# Patient Record
Sex: Male | Born: 1960
Health system: Southern US, Community
[De-identification: ages and names within clinical notes are randomized; demographics above are authoritative.]

## PROBLEM LIST (undated history)

## (undated) DIAGNOSIS — I10 Essential (primary) hypertension: Secondary | ICD-10-CM

## (undated) DIAGNOSIS — M1990 Unspecified osteoarthritis, unspecified site: Secondary | ICD-10-CM

## (undated) DIAGNOSIS — I82409 Acute embolism and thrombosis of unspecified deep veins of unspecified lower extremity: Secondary | ICD-10-CM

## (undated) DIAGNOSIS — M62838 Other muscle spasm: Secondary | ICD-10-CM

## (undated) DIAGNOSIS — M961 Postlaminectomy syndrome, not elsewhere classified: Secondary | ICD-10-CM

## (undated) DIAGNOSIS — M5137 Other intervertebral disc degeneration, lumbosacral region: Secondary | ICD-10-CM

## (undated) DIAGNOSIS — K4090 Unilateral inguinal hernia, without obstruction or gangrene, not specified as recurrent: Secondary | ICD-10-CM

## (undated) DIAGNOSIS — M51379 Other intervertebral disc degeneration, lumbosacral region without mention of lumbar back pain or lower extremity pain: Secondary | ICD-10-CM

## (undated) DIAGNOSIS — E559 Vitamin D deficiency, unspecified: Secondary | ICD-10-CM

## (undated) DIAGNOSIS — R0989 Other specified symptoms and signs involving the circulatory and respiratory systems: Secondary | ICD-10-CM

## (undated) DIAGNOSIS — F1120 Opioid dependence, uncomplicated: Secondary | ICD-10-CM

## (undated) DIAGNOSIS — F410 Panic disorder [episodic paroxysmal anxiety] without agoraphobia: Secondary | ICD-10-CM

## (undated) DIAGNOSIS — Z9862 Peripheral vascular angioplasty status: Secondary | ICD-10-CM

## (undated) DIAGNOSIS — E119 Type 2 diabetes mellitus without complications: Secondary | ICD-10-CM

## (undated) DIAGNOSIS — E785 Hyperlipidemia, unspecified: Secondary | ICD-10-CM

## (undated) DIAGNOSIS — N529 Male erectile dysfunction, unspecified: Secondary | ICD-10-CM

## (undated) HISTORY — DX: Other muscle spasm: M62.838

## (undated) HISTORY — DX: Postlaminectomy syndrome, not elsewhere classified: M96.1

## (undated) HISTORY — PX: LAMINECTOMY: SHX219

## (undated) HISTORY — DX: Male erectile dysfunction, unspecified: N52.9

## (undated) HISTORY — DX: Panic disorder (episodic paroxysmal anxiety): F41.0

## (undated) HISTORY — DX: Vitamin D deficiency, unspecified: E55.9

## (undated) HISTORY — DX: Peripheral vascular angioplasty status: Z98.62

## (undated) HISTORY — DX: Opioid dependence, uncomplicated: F11.20

## (undated) HISTORY — PX: BACK SURGERY: SHX140

## (undated) HISTORY — DX: Unspecified osteoarthritis, unspecified site: M19.90

## (undated) HISTORY — DX: Hyperlipidemia, unspecified: E78.5

## (undated) HISTORY — DX: Other intervertebral disc degeneration, lumbosacral region: M51.37

## (undated) HISTORY — DX: Other intervertebral disc degeneration, lumbosacral region without mention of lumbar back pain or lower extremity pain: M51.379

## (undated) HISTORY — DX: Essential (primary) hypertension: I10

## (undated) HISTORY — DX: Other specified symptoms and signs involving the circulatory and respiratory systems: R09.89

---

## 1999-03-07 ENCOUNTER — Encounter: Payer: Self-pay | Admitting: Neurosurgery

## 1999-03-07 ENCOUNTER — Observation Stay (HOSPITAL_COMMUNITY): Admission: RE | Admit: 1999-03-07 | Discharge: 1999-03-07 | Payer: Self-pay | Admitting: Neurosurgery

## 2001-07-09 HISTORY — PX: INGUINAL HERNIA REPAIR: SUR1180

## 2001-07-09 HISTORY — PX: ARTHRODESIS: SHX136

## 2008-07-09 HISTORY — PX: CARDIAC CATHETERIZATION: SHX172

## 2011-11-23 ENCOUNTER — Encounter: Payer: Self-pay | Admitting: Cardiovascular Disease

## 2011-11-29 ENCOUNTER — Ambulatory Visit: Payer: Self-pay | Admitting: Cardiovascular Disease

## 2012-04-21 ENCOUNTER — Encounter: Payer: Self-pay | Admitting: Cardiovascular Disease

## 2012-04-21 ENCOUNTER — Ambulatory Visit (INDEPENDENT_AMBULATORY_CARE_PROVIDER_SITE_OTHER): Payer: PRIVATE HEALTH INSURANCE | Admitting: Cardiovascular Disease

## 2012-04-21 VITALS — BP 122/82 | HR 78 | Ht 70.0 in | Wt 200.0 lb

## 2012-04-21 DIAGNOSIS — R079 Chest pain, unspecified: Secondary | ICD-10-CM

## 2012-04-21 DIAGNOSIS — R42 Dizziness and giddiness: Secondary | ICD-10-CM | POA: Insufficient documentation

## 2012-04-21 DIAGNOSIS — R000 Tachycardia, unspecified: Secondary | ICD-10-CM

## 2012-04-21 DIAGNOSIS — F17200 Nicotine dependence, unspecified, uncomplicated: Secondary | ICD-10-CM

## 2012-04-21 DIAGNOSIS — E785 Hyperlipidemia, unspecified: Secondary | ICD-10-CM | POA: Insufficient documentation

## 2012-04-21 DIAGNOSIS — I739 Peripheral vascular disease, unspecified: Secondary | ICD-10-CM | POA: Insufficient documentation

## 2012-04-21 DIAGNOSIS — I10 Essential (primary) hypertension: Secondary | ICD-10-CM

## 2012-04-21 NOTE — Assessment & Plan Note (Signed)
We have recommended he restart his Lipitor.

## 2012-04-21 NOTE — Assessment & Plan Note (Signed)
He reports having disease of the left lower extremity. We have encouraged him to quit smoking and restart his cholesterol medication.

## 2012-04-21 NOTE — Assessment & Plan Note (Signed)
We have recommended he monitor his blood pressure at home. Potentially the lisinopril could be weaned if not needed.

## 2012-04-21 NOTE — Assessment & Plan Note (Signed)
Dizziness has resolved. Likely secondary to orthostasis from lisinopril. Improved symptoms on low-dose lisinopril. We have suggested he closely monitor his blood pressure. Uncertain if he needs any blood pressure medication without additional numbers/measurements. Perhaps this could be held if blood pressure is well controlled without medication.

## 2012-04-21 NOTE — Assessment & Plan Note (Signed)
Episode of tachycardia on last clinic visit his primary care physician. He denies any symptoms or additional episodes of tachycardia. We will try to obtain the EKG for our records. We have asked him to monitor his heart rate by checking his pulse during the daytime. We will order a Holter monitor for any symptoms of shortness of breath or chest tightness.

## 2012-04-21 NOTE — Patient Instructions (Addendum)
You are doing well. Please restart cholesterol pill and vit D  Please call us if you have new issues that need to be addressed before your next appt.  Your physician wants you to follow-up in: 12 months.  You will receive a reminder letter in the mail two months in advance. If you don't receive a letter, please call our office to schedule the follow-up appointment.

## 2012-04-21 NOTE — Assessment & Plan Note (Signed)
We have encouraged him to try electronic cigarettes. He currently smokes 2 packs per day.

## 2012-04-21 NOTE — Progress Notes (Signed)
Patient ID: Michael Giles, male    DOB: [birth year], 51 y.o.   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  He reports that when he was on lisinopril 10 mg daily, he would have dizziness when standing from a squatting position. Lisinopril was decreased to 5 mg daily and his symptoms have resolved. He attributed his symptoms to too much blood pressure medication.  Heart rate was reported in the 120 range on his last visit to his primary care physician. He denies any symptoms of palpitations or tachycardia. This has not been an issue in the past. EKG is not available for our review today.  Otherwise he feels well, is active with no complaints..  He reports that his father had heart attack in his 72s and is a long  Smoker.  He did have muscle cramping previously on statin and the dose was cut in half . He is currently not on a statin . Also problems in the past with low vitamin D. He does not take vitamin D at this time   EKG today shows normal sinus rhythm with rate 78 beats per minute with no significant ST or T. changes    Outpatient Encounter Prescriptions as of 04/21/2012  Medication Sig Dispense Refill  . aspirin 81 MG tablet Take 81 mg by mouth daily.      . cilostazol (PLETAL) 100 MG tablet Take 100 mg by mouth 2 (two) times daily.      . Cyanocobalamin (VITAMIN B 12 PO) Take 2,000 mg by mouth daily.      Marland Kitchen lisinopril (PRINIVIL,ZESTRIL) 5 MG tablet Take 5 mg by mouth daily.      Marland Kitchen oxyCODONE (OXYCONTIN) 80 MG 12 hr tablet Take 80 mg by mouth 3 (three) times daily.        Review of Systems  Constitutional:  Negative.   HENT: Negative.   Eyes: Negative.   Respiratory: Negative.   Cardiovascular: Negative.   Gastrointestinal: Negative.   Musculoskeletal: Positive for back pain.  Skin: Negative.   Neurological: Negative.   Hematological: Negative.   Psychiatric/Behavioral: Negative.   All other systems reviewed and are negative.    BP 122/82  Pulse 78  Ht 5\' 10"  (1.778 m)  Wt 200 lb (90.719 kg)  BMI 28.70 kg/m2  Physical Exam  Nursing note and vitals reviewed. Constitutional: He is oriented to person, place, and time. He appears well-developed and well-nourished.  HENT:  Head: Normocephalic.  Nose: Nose normal.  Mouth/Throat: Oropharynx is clear and moist.  Eyes: Conjunctivae normal are normal. Pupils are equal, round, and reactive to light.  Neck: Normal range of motion. Neck supple. No JVD present.  Cardiovascular: Normal rate, regular rhythm, S1 normal, S2 normal, normal heart sounds and intact distal pulses.  Exam reveals no gallop and no friction rub.   No murmur heard. Pulmonary/Chest: Effort normal and breath sounds normal. No respiratory distress. He has no wheezes. He has no rales. He exhibits no tenderness.  Abdominal: Soft. Bowel sounds are normal. He exhibits no distension. There is no tenderness.  Musculoskeletal: Normal range of motion. He exhibits no edema and no tenderness.  Lymphadenopathy:    He has no  cervical adenopathy.  Neurological: He is alert and oriented to person, place, and time. Coordination normal.  Skin: Skin is warm and dry. No rash noted. No erythema.  Psychiatric: He has a normal mood and affect. His behavior is normal. Judgment and thought content normal.           Assessment and Plan

## 2012-05-07 ENCOUNTER — Encounter: Payer: Self-pay | Admitting: Cardiovascular Disease

## 2012-05-26 ENCOUNTER — Ambulatory Visit: Payer: Self-pay | Admitting: Family Medicine

## 2012-11-28 LAB — HEMOGLOBIN A1C: HEMOGLOBIN A1C: 5.9 % (ref 4.0–6.0)

## 2013-03-03 ENCOUNTER — Ambulatory Visit: Payer: Self-pay | Admitting: Family Medicine

## 2013-05-15 ENCOUNTER — Ambulatory Visit: Payer: Self-pay | Admitting: Vascular Surgery

## 2014-01-19 LAB — LIPID PANEL
Cholesterol: 175 mg/dL (ref 0–200)
HDL: 39 mg/dL (ref 35–70)
LDL CALC: 100 mg/dL
Triglycerides: 182 mg/dL — AB (ref 40–160)

## 2014-03-24 ENCOUNTER — Encounter (HOSPITAL_COMMUNITY): Payer: Self-pay | Admitting: Emergency Medicine

## 2014-03-24 ENCOUNTER — Emergency Department (HOSPITAL_COMMUNITY)
Admission: EM | Admit: 2014-03-24 | Discharge: 2014-03-24 | Disposition: A | Discharge status: Home / Self Care | Payer: PRIVATE HEALTH INSURANCE | Attending: Emergency Medicine | Admitting: Emergency Medicine

## 2014-03-24 DIAGNOSIS — Z9861 Coronary angioplasty status: Secondary | ICD-10-CM | POA: Diagnosis not present

## 2014-03-24 DIAGNOSIS — F17200 Nicotine dependence, unspecified, uncomplicated: Secondary | ICD-10-CM | POA: Diagnosis not present

## 2014-03-24 DIAGNOSIS — S0083XA Contusion of other part of head, initial encounter: Secondary | ICD-10-CM | POA: Insufficient documentation

## 2014-03-24 DIAGNOSIS — S0003XA Contusion of scalp, initial encounter: Secondary | ICD-10-CM | POA: Diagnosis not present

## 2014-03-24 DIAGNOSIS — Z9889 Other specified postprocedural states: Secondary | ICD-10-CM | POA: Insufficient documentation

## 2014-03-24 DIAGNOSIS — Y9389 Activity, other specified: Secondary | ICD-10-CM | POA: Insufficient documentation

## 2014-03-24 DIAGNOSIS — IMO0002 Reserved for concepts with insufficient information to code with codable children: Secondary | ICD-10-CM | POA: Insufficient documentation

## 2014-03-24 DIAGNOSIS — Z862 Personal history of diseases of the blood and blood-forming organs and certain disorders involving the immune mechanism: Secondary | ICD-10-CM | POA: Insufficient documentation

## 2014-03-24 DIAGNOSIS — F3289 Other specified depressive episodes: Secondary | ICD-10-CM | POA: Diagnosis not present

## 2014-03-24 DIAGNOSIS — Z8679 Personal history of other diseases of the circulatory system: Secondary | ICD-10-CM | POA: Insufficient documentation

## 2014-03-24 DIAGNOSIS — Y92410 Unspecified street and highway as the place of occurrence of the external cause: Secondary | ICD-10-CM | POA: Insufficient documentation

## 2014-03-24 DIAGNOSIS — F329 Major depressive disorder, single episode, unspecified: Secondary | ICD-10-CM | POA: Insufficient documentation

## 2014-03-24 DIAGNOSIS — Z7982 Long term (current) use of aspirin: Secondary | ICD-10-CM | POA: Diagnosis not present

## 2014-03-24 DIAGNOSIS — E559 Vitamin D deficiency, unspecified: Secondary | ICD-10-CM | POA: Insufficient documentation

## 2014-03-24 DIAGNOSIS — S0993XA Unspecified injury of face, initial encounter: Secondary | ICD-10-CM | POA: Diagnosis present

## 2014-03-24 DIAGNOSIS — Z8639 Personal history of other endocrine, nutritional and metabolic disease: Secondary | ICD-10-CM | POA: Insufficient documentation

## 2014-03-24 DIAGNOSIS — Z981 Arthrodesis status: Secondary | ICD-10-CM | POA: Insufficient documentation

## 2014-03-24 DIAGNOSIS — Z79899 Other long term (current) drug therapy: Secondary | ICD-10-CM | POA: Diagnosis not present

## 2014-03-24 DIAGNOSIS — I10 Essential (primary) hypertension: Secondary | ICD-10-CM | POA: Diagnosis not present

## 2014-03-24 DIAGNOSIS — M129 Arthropathy, unspecified: Secondary | ICD-10-CM | POA: Insufficient documentation

## 2014-03-24 DIAGNOSIS — S0120XA Unspecified open wound of nose, initial encounter: Secondary | ICD-10-CM | POA: Insufficient documentation

## 2014-03-24 DIAGNOSIS — Z8739 Personal history of other diseases of the musculoskeletal system and connective tissue: Secondary | ICD-10-CM | POA: Diagnosis not present

## 2014-03-24 DIAGNOSIS — S1093XA Contusion of unspecified part of neck, initial encounter: Secondary | ICD-10-CM | POA: Diagnosis not present

## 2014-03-24 DIAGNOSIS — F410 Panic disorder [episodic paroxysmal anxiety] without agoraphobia: Secondary | ICD-10-CM | POA: Insufficient documentation

## 2014-03-24 DIAGNOSIS — S199XXA Unspecified injury of neck, initial encounter: Secondary | ICD-10-CM

## 2014-03-24 MED ORDER — OXYCODONE-ACETAMINOPHEN 10-325 MG PO TABS: 1.0000 | ORAL_TABLET | ORAL | Status: DC | PRN

## 2014-03-24 MED ORDER — DIAZEPAM 2 MG PO TABS: 2.0000 mg | ORAL_TABLET | Freq: Four times a day (QID) | ORAL | Status: DC | PRN
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
## 2014-03-24 NOTE — Discharge Instructions (Signed)
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
## 2014-03-27 ENCOUNTER — Emergency Department: Payer: Self-pay | Admitting: Internal Medicine
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
## 2014-04-03 ENCOUNTER — Emergency Department: Payer: Self-pay | Admitting: Emergency Medicine

## 2014-04-26 ENCOUNTER — Ambulatory Visit: Payer: Self-pay | Admitting: Family Medicine

## 2014-11-22 ENCOUNTER — Other Ambulatory Visit: Payer: Self-pay

## 2014-11-22 DIAGNOSIS — M5136 Other intervertebral disc degeneration, lumbar region: Secondary | ICD-10-CM | POA: Insufficient documentation

## 2014-11-22 DIAGNOSIS — I10 Essential (primary) hypertension: Secondary | ICD-10-CM | POA: Insufficient documentation

## 2014-11-22 DIAGNOSIS — M51369 Other intervertebral disc degeneration, lumbar region without mention of lumbar back pain or lower extremity pain: Secondary | ICD-10-CM | POA: Insufficient documentation

## 2014-11-22 DIAGNOSIS — Z79899 Other long term (current) drug therapy: Secondary | ICD-10-CM | POA: Insufficient documentation

## 2014-11-22 DIAGNOSIS — I739 Peripheral vascular disease, unspecified: Secondary | ICD-10-CM | POA: Insufficient documentation

## 2014-11-22 DIAGNOSIS — E785 Hyperlipidemia, unspecified: Secondary | ICD-10-CM | POA: Insufficient documentation

## 2014-11-22 DIAGNOSIS — N529 Male erectile dysfunction, unspecified: Secondary | ICD-10-CM | POA: Insufficient documentation

## 2014-11-22 DIAGNOSIS — M961 Postlaminectomy syndrome, not elsewhere classified: Secondary | ICD-10-CM | POA: Insufficient documentation

## 2014-11-22 DIAGNOSIS — F17200 Nicotine dependence, unspecified, uncomplicated: Secondary | ICD-10-CM | POA: Insufficient documentation

## 2014-11-22 DIAGNOSIS — F410 Panic disorder [episodic paroxysmal anxiety] without agoraphobia: Secondary | ICD-10-CM | POA: Insufficient documentation

## 2014-11-22 DIAGNOSIS — F329 Major depressive disorder, single episode, unspecified: Secondary | ICD-10-CM | POA: Insufficient documentation

## 2014-11-22 DIAGNOSIS — G479 Sleep disorder, unspecified: Secondary | ICD-10-CM | POA: Insufficient documentation

## 2014-11-22 DIAGNOSIS — F32A Depression, unspecified: Secondary | ICD-10-CM | POA: Insufficient documentation

## 2014-11-22 DIAGNOSIS — F1120 Opioid dependence, uncomplicated: Secondary | ICD-10-CM | POA: Insufficient documentation

## 2014-11-22 DIAGNOSIS — R0989 Other specified symptoms and signs involving the circulatory and respiratory systems: Secondary | ICD-10-CM | POA: Insufficient documentation

## 2014-12-08 ENCOUNTER — Encounter: Payer: Self-pay | Admitting: Family Medicine

## 2014-12-08 ENCOUNTER — Ambulatory Visit (INDEPENDENT_AMBULATORY_CARE_PROVIDER_SITE_OTHER): Payer: 59 | Admitting: Family Medicine

## 2014-12-08 VITALS — BP 140/88 | HR 76 | Resp 15 | Ht 67.0 in | Wt 190.1 lb

## 2014-12-08 DIAGNOSIS — G8929 Other chronic pain: Secondary | ICD-10-CM | POA: Diagnosis not present

## 2014-12-08 DIAGNOSIS — M5416 Radiculopathy, lumbar region: Secondary | ICD-10-CM | POA: Diagnosis not present

## 2014-12-08 DIAGNOSIS — I10 Essential (primary) hypertension: Secondary | ICD-10-CM

## 2014-12-08 MED ORDER — LISINOPRIL 20 MG PO TABS: 20.0000 mg | ORAL_TABLET | Freq: Every day | ORAL | Status: DC

## 2014-12-08 MED ORDER — PERCOCET 10-325 MG PO TABS: 1.0000 | ORAL_TABLET | Freq: Three times a day (TID) | ORAL | Status: DC | PRN

## 2014-12-08 MED ORDER — OXYCODONE HCL ER 30 MG PO T12A
30.0000 mg | EXTENDED_RELEASE_TABLET | Freq: Two times a day (BID) | ORAL | Status: DC
Start: 2014-12-08 — End: 2015-01-06

## 2014-12-08 NOTE — Progress Notes (Signed)
Name: Michael Giles   MRN: [id number]    DOB: 1960-12-24   Date:12/08/2014       Progress Note  Subjective  Chief Complaint  Chief Complaint  Patient presents with  . Back Pain    HPI Patient is here for medication refills for chronic low back pain. Pain is rated at 8/10. Described as stinging and burning in the left leg, down to his knee. He is on Oxycontin 30mg  1 tab every 12 hours and Oxycodone 10-325mg  1 tablet every 8 hours as needed. Pain is relieved by opioids. No side effects reported from medications. Patient also has received lower back injections by Dr[facility] Abigail [facility]Barco [facility] which have helped temporarily.   No problem-specific assessment & plan notes found for this encounter.   Past Medical History  Diagnosis Date  . Impotence of organic origin   . Opioid type dependence, continuous   . Postlaminectomy syndrome, lumbar region   . Spasm of muscle   . Other symptoms involving cardiovascular system   . Degeneration of lumbar or lumbosacral intervertebral disc   . Unspecified vitamin D deficiency   . Hypertension   . Hyperlipidemia   . Panic disorder without agoraphobia   . History of angioplasty   . Arthritis    Past Surgical History  Procedure Laterality Date  . Laminectomy    . Inguinal hernia repair  2003  . Arthrodesis  2003  . Cardiac catheterization  2010    [facility]/65% blockage left leg no stent  . Back surgery     Family History  Problem Relation Age of Onset  . Heart disease Father   . Heart attack Father     History  Substance Use Topics  . Smoking status: Current Every Day Smoker -- 2.00 packs/day for 32 years    Types: Cigarettes  . Smokeless tobacco: Not on file  . Alcohol Use: No     Current outpatient prescriptions:  .  aspirin 81 MG tablet, Take 81 mg by mouth daily., Disp: , Rfl:  .  cilostazol (PLETAL) 100 MG tablet, Take 100 mg by mouth 2 (two) times daily., Disp: , Rfl:  .  lisinopril (PRINIVIL,ZESTRIL) 20 MG tablet, Take by  mouth., Disp: , Rfl:  .  OxyCODONE HCl ER 30 MG T12A, Take 30 mg by mouth every 12 (twelve) hours., Disp: 60 each, Rfl: 0 .  PERCOCET 10-325 MG per tablet, Take 1 tablet by mouth every 8 (eight) hours as needed for pain., Disp: 90 tablet, Rfl: 0 .  tadalafil (CIALIS) 10 MG tablet, Take 10 mg by mouth daily as needed for erectile dysfunction. , Disp: , Rfl:   Allergies  Allergen Reactions  . Crestor [Rosuvastatin]     joint pain    Review of Systems  Musculoskeletal: Positive for back pain.      Objective  Filed Vitals:   12/08/14 0927 12/08/14 1055  BP: 160/90 140/88  Pulse: 76   Resp: 15   Height: 5\' 7"  (1.702 m)   Weight: 190 lb 1.6 oz (86.229 kg)      Physical Exam  Constitutional: He is well-developed, well-nourished, and in no distress.  Cardiovascular: Regular rhythm.   Pulmonary/Chest: Breath sounds normal.  Musculoskeletal:       Lumbar back: He exhibits pain.       Back:  Stinging and burning of left thigh. Pain on the right side of spine, described as a constant throb all the time.  Vitals reviewed.   Assessment & Plan  1. Chronic radicular lumbar pain  - OxyCODONE HCl ER 30 MG T12A; Take 30 mg by mouth every 12 (twelve) hours.  Dispense: 60 each; Refill: 0 - PERCOCET 10-325 MG per tablet; Take 1 tablet by mouth every 8 (eight) hours as needed for pain.  Dispense: 90 tablet; Refill: 0 Chronic radicular low back pain: Continue with Oxycontin and Oxycodone as needed. New Controlled Substances contract signed. Pt. Is going to follow up with neurosurgery as well.  2. Essential hypertension  - lisinopril (PRINIVIL,ZESTRIL) 20 MG tablet; Take 1 tablet (20 mg total) by mouth daily.  Dispense: 30 tablet; Refill: 2 Hypertension: Repeat BP is lower. Reassured. Follow up in 1 month.

## 2014-12-16 ENCOUNTER — Telehealth: Payer: Self-pay | Admitting: Family Medicine

## 2014-12-16 DIAGNOSIS — G8929 Other chronic pain: Secondary | ICD-10-CM

## 2014-12-16 DIAGNOSIS — M5416 Radiculopathy, lumbar region: Principal | ICD-10-CM

## 2014-12-17 NOTE — Telephone Encounter (Signed)
Can you please help me.  Was there a referral entered or do I need to do something?

## 2014-12-17 NOTE — Telephone Encounter (Signed)
Called and left a message for patient. Referral for [facility] is entered in his chart

## 2015-01-06 ENCOUNTER — Ambulatory Visit: Payer: Self-pay | Admitting: Family Medicine

## 2015-01-06 ENCOUNTER — Encounter: Payer: Self-pay | Admitting: Family Medicine

## 2015-01-06 ENCOUNTER — Ambulatory Visit (INDEPENDENT_AMBULATORY_CARE_PROVIDER_SITE_OTHER): Payer: 59 | Admitting: Family Medicine

## 2015-01-06 VITALS — BP 138/84 | HR 86 | Temp 97.9°F | Resp 18 | Wt 193.4 lb

## 2015-01-06 DIAGNOSIS — I10 Essential (primary) hypertension: Secondary | ICD-10-CM | POA: Diagnosis not present

## 2015-01-06 DIAGNOSIS — G8929 Other chronic pain: Secondary | ICD-10-CM

## 2015-01-06 DIAGNOSIS — M5416 Radiculopathy, lumbar region: Secondary | ICD-10-CM

## 2015-01-06 MED ORDER — OXYCODONE HCL ER 30 MG PO T12A: 30.0000 mg | EXTENDED_RELEASE_TABLET | Freq: Two times a day (BID) | ORAL | Status: DC

## 2015-01-06 MED ORDER — PERCOCET 10-325 MG PO TABS: 1.0000 | ORAL_TABLET | Freq: Three times a day (TID) | ORAL | Status: DC | PRN

## 2015-01-06 NOTE — Progress Notes (Signed)
Name: Michael Giles   MRN: [id number]    DOB: 1961/07/03   Date:01/06/2015       Progress Note  Subjective  Chief Complaint  Chief Complaint  Patient presents with  . Hypertension  . Back Pain    Hypertension This is a chronic problem. The problem is unchanged. The problem is controlled. Pertinent negatives include no chest pain, headaches, palpitations or shortness of breath. Past treatments include ACE inhibitors. The current treatment provides moderate improvement. There is no history of angina, kidney disease, CAD/MI or CVA.  Back Pain This is a chronic problem. The problem is unchanged. The pain is present in the lumbar spine and gluteal. The quality of the pain is described as burning and stabbing. The pain radiates to the left knee and left thigh. The pain is at a severity of 8/10. The pain is severe. The symptoms are aggravated by position (walking). Associated symptoms include numbness (numbness in left leg down to his left knee). Pertinent negatives include no bladder incontinence, bowel incontinence, chest pain or headaches. He has tried analgesics and NSAIDs for the symptoms.      Past Medical History  Diagnosis Date  . Impotence of organic origin   . Opioid type dependence, continuous   . Postlaminectomy syndrome, lumbar region   . Spasm of muscle   . Other symptoms involving cardiovascular system   . Degeneration of lumbar or lumbosacral intervertebral disc   . Unspecified vitamin D deficiency   . Hypertension   . Hyperlipidemia   . Panic disorder without agoraphobia   . History of angioplasty   . Arthritis     Past Surgical History  Procedure Laterality Date  . Laminectomy    . Inguinal hernia repair  2003  . Arthrodesis  2003  . Cardiac catheterization  2010    [facility]/65% blockage left leg no stent  . Back surgery      Family History  Problem Relation Age of Onset  . Heart disease Father   . Heart attack Father     History   Social History  .  Marital Status: Married    Spouse Name: N/A  . Number of Children: N/A  . Years of Education: N/A   Occupational History  . Not on file.   Social History Main Topics  . Smoking status: Current Every Day Smoker -- 2.00 packs/day for 32 years    Types: Cigarettes  . Smokeless tobacco: Not on file  . Alcohol Use: No  . Drug Use: No  . Sexual Activity: Not on file   Other Topics Concern  . Not on file   Social History Narrative     Current outpatient prescriptions:  .  aspirin 81 MG tablet, Take 81 mg by mouth daily., Disp: , Rfl:  .  cilostazol (PLETAL) 100 MG tablet, Take 100 mg by mouth 2 (two) times daily., Disp: , Rfl:  .  lisinopril (PRINIVIL,ZESTRIL) 20 MG tablet, Take 1 tablet (20 mg total) by mouth daily., Disp: 30 tablet, Rfl: 2 .  OxyCODONE HCl ER 30 MG T12A, Take 30 mg by mouth every 12 (twelve) hours., Disp: 60 each, Rfl: 0 .  PERCOCET 10-325 MG per tablet, Take 1 tablet by mouth every 8 (eight) hours as needed for pain., Disp: 90 tablet, Rfl: 0 .  tadalafil (CIALIS) 10 MG tablet, Take 10 mg by mouth daily as needed for erectile dysfunction. , Disp: , Rfl:   Allergies  Allergen Reactions  . Crestor [Rosuvastatin]  joint pain     Review of Systems  Respiratory: Negative for shortness of breath.   Cardiovascular: Negative for chest pain and palpitations.  Gastrointestinal: Negative for bowel incontinence.  Genitourinary: Negative for bladder incontinence.  Musculoskeletal: Positive for back pain.  Neurological: Positive for numbness (numbness in left leg down to his left knee). Negative for headaches.      Objective  Filed Vitals:   01/06/15 1010  BP: 138/84  Pulse: 86  Temp: 97.9 F (36.6 C)  Resp: 18  Weight: 193 lb 6 oz (87.714 kg)  SpO2: 99%    Physical Exam  Constitutional: He is well-developed, well-nourished, and in no distress.  HENT:  Head: Normocephalic and atraumatic.  Cardiovascular: Normal rate and regular rhythm.     Pulmonary/Chest: Effort normal and breath sounds normal.  Musculoskeletal:       Lumbar back: He exhibits tenderness, pain and spasm.       Back:  Nursing note and vitals reviewed.     Assessment & Plan 1. Essential hypertension Blood pressure is stable and controlled on lisinopril 20 mg daily. Continue therapy. Recheck in 3 months  2. Chronic radicular lumbar pain Patient has chronic radicular low back pain. He has been referred to [facility] for management of chronic opioid therapy. He is also following up with a spine specialist.  - OxyCODONE HCl ER 30 MG T12A; Take 30 mg by mouth every 12 (twelve) hours.  Dispense: 60 each; Refill: 0 - PERCOCET 10-325 MG per tablet; Take 1 tablet by mouth every 8 (eight) hours as needed for pain.  Dispense: 90 tablet; Refill: 0   Michael Giles Michael A. Faylene KurtzShah [facility] 01/06/2015 6:13 PM

## 2015-01-14 ENCOUNTER — Telehealth: Payer: Self-pay | Admitting: Family Medicine

## 2015-01-14 NOTE — Telephone Encounter (Signed)
Patient is checking status on the authorization for oxycodone. He will be completely out in a couple of days.

## 2015-01-17 NOTE — Telephone Encounter (Signed)
Patient needs pre-authorization for medication to be refilled, I told patient to contact pain management per Dr. Sherryll BurgerShah

## 2015-01-17 NOTE — Telephone Encounter (Signed)
Patient calling to get update on when RX will be ready to pick up?

## 2015-01-25 ENCOUNTER — Telehealth: Payer: Self-pay | Admitting: Family Medicine

## 2015-01-25 NOTE — Telephone Encounter (Signed)
PER SHELLY KNOWS ABOUT THIS AND IS TAKING CARE OF IT.

## 2015-01-25 NOTE — Telephone Encounter (Signed)
STATUS ON AUTH

## 2015-01-25 NOTE — Telephone Encounter (Signed)
Spoke with Mr. Ander GasterHorner, explained to him I am waiting on an approval or denial from his insurance company once I receive that, I'll notify him of the status.

## 2015-02-01 ENCOUNTER — Telehealth: Payer: Self-pay

## 2015-02-01 NOTE — Telephone Encounter (Signed)
Called patient and notified him that his Medication Oxycodone was approved but, was going to take about 2 hrs to process, he thanked he for all my help. I told him that NPS would notify his pharmacy once approval had been processed.

## 2015-02-03 ENCOUNTER — Ambulatory Visit (INDEPENDENT_AMBULATORY_CARE_PROVIDER_SITE_OTHER): Payer: BLUE CROSS/BLUE SHIELD | Admitting: Family Medicine

## 2015-02-03 ENCOUNTER — Encounter: Payer: Self-pay | Admitting: Family Medicine

## 2015-02-03 VITALS — BP 130/77 | HR 95 | Temp 98.5°F | Resp 18 | Ht 67.0 in | Wt 192.0 lb

## 2015-02-03 DIAGNOSIS — G8929 Other chronic pain: Secondary | ICD-10-CM

## 2015-02-03 DIAGNOSIS — M5416 Radiculopathy, lumbar region: Secondary | ICD-10-CM

## 2015-02-03 MED ORDER — PERCOCET 10-325 MG PO TABS: 1.0000 | ORAL_TABLET | Freq: Three times a day (TID) | ORAL | Status: DC | PRN

## 2015-02-03 MED ORDER — OXYCODONE HCL ER 30 MG PO T12A: 30.0000 mg | EXTENDED_RELEASE_TABLET | Freq: Two times a day (BID) | ORAL | Status: DC

## 2015-02-03 NOTE — Progress Notes (Signed)
Name: Michael Giles   MRN: [id number]    DOB: Feb 24, 1961   Date:02/03/2015       Progress Note  Subjective  Chief Complaint  Chief Complaint  Patient presents with  . Follow-up    4 wk.   . Hyperlipidemia  . Medication Refill    Back Pain This is a chronic problem. The problem is unchanged. The pain is present in the lumbar spine and gluteal. The quality of the pain is described as burning and stabbing. The pain radiates to the left knee and left thigh. The pain is at a severity of 8/10. The pain is severe. The symptoms are aggravated by position (walking). Associated symptoms include numbness (numbness in left leg down to his left knee). Pertinent negatives include no bladder incontinence, bowel incontinence, dysuria or fever. He has tried analgesics and NSAIDs (Pt. has an appointment with Pain Clinic on [facility] 15th, 2016.) for the symptoms. The treatment provided significant relief.      Past Medical History  Diagnosis Date  . Impotence of organic origin   . Opioid type dependence, continuous   . Postlaminectomy syndrome, lumbar region   . Spasm of muscle   . Other symptoms involving cardiovascular system   . Degeneration of lumbar or lumbosacral intervertebral disc   . Unspecified vitamin D deficiency   . Hypertension   . Hyperlipidemia   . Panic disorder without agoraphobia   . History of angioplasty   . Arthritis     Past Surgical History  Procedure Laterality Date  . Laminectomy    . Inguinal hernia repair  2003  . Arthrodesis  2003  . Cardiac catheterization  2010    [facility]/65% blockage left leg no stent  . Back surgery      Family History  Problem Relation Age of Onset  . Heart disease Father   . Heart attack Father     History   Social History  . Marital Status: Married    Spouse Name: N/A  . Number of Children: N/A  . Years of Education: N/A   Occupational History  . Not on file.   Social History Main Topics  . Smoking status: Current Every  Day Smoker -- 2.00 packs/day for 32 years    Types: Cigarettes  . Smokeless tobacco: Not on file  . Alcohol Use: No  . Drug Use: No  . Sexual Activity: Not on file   Other Topics Concern  . Not on file   Social History Narrative     Current outpatient prescriptions:  .  aspirin 81 MG tablet, Take 81 mg by mouth daily., Disp: , Rfl:  .  cilostazol (PLETAL) 100 MG tablet, Take 100 mg by mouth 2 (two) times daily., Disp: , Rfl:  .  lisinopril (PRINIVIL,ZESTRIL) 20 MG tablet, Take 1 tablet (20 mg total) by mouth daily., Disp: 30 tablet, Rfl: 2 .  OxyCODONE HCl ER 30 MG T12A, Take 30 mg by mouth every 12 (twelve) hours., Disp: 60 each, Rfl: 0 .  PERCOCET 10-325 MG per tablet, Take 1 tablet by mouth every 8 (eight) hours as needed for pain., Disp: 90 tablet, Rfl: 0 .  tadalafil (CIALIS) 10 MG tablet, Take 10 mg by mouth daily as needed for erectile dysfunction. , Disp: , Rfl:   Allergies  Allergen Reactions  . Crestor [Rosuvastatin]     joint pain     Review of Systems  Constitutional: Negative for fever.  Gastrointestinal: Negative for bowel incontinence.  Genitourinary: Negative  for bladder incontinence and dysuria.  Musculoskeletal: Positive for back pain.  Neurological: Positive for numbness (numbness in left leg down to his left knee).      Objective  Filed Vitals:   02/03/15 0936  BP: 130/77  Pulse: 95  Temp: 98.5 F (36.9 C)  TempSrc: Oral  Resp: 18  Height: 5\' 7"  (1.702 m)  Weight: 192 lb (87.091 kg)  SpO2: 97%    Physical Exam  Constitutional: He is oriented to person, place, and time and well-developed, well-nourished, and in no distress.  Cardiovascular: Normal rate and regular rhythm.   Pulmonary/Chest: Effort normal and breath sounds normal.  Musculoskeletal:       Lumbar back: He exhibits tenderness and pain. He exhibits no spasm.       Back:  Neurological: He is alert and oriented to person, place, and time.  Nursing note and vitals  reviewed.    Assessment & Plan 1. Chronic radicular lumbar pain Patient is compliant with controlled substances agreement. He has been referred to pain management and has an appointment on September 15. He is taking the medications as directed. Refills provided and follow-up in 4 weeks - OxyCODONE HCl ER 30 MG T12A; Take 30 mg by mouth every 12 (twelve) hours.  Dispense: 60 each; Refill: 0 - PERCOCET 10-325 MG per tablet; Take 1 tablet by mouth every 8 (eight) hours as needed for pain.  Dispense: 90 tablet; Refill: 0    [name] Asad A. Faylene Kurtz [facility] 02/03/2015 9:57 AM

## 2015-03-04 ENCOUNTER — Encounter: Payer: Self-pay | Admitting: Family Medicine

## 2015-03-04 ENCOUNTER — Ambulatory Visit (INDEPENDENT_AMBULATORY_CARE_PROVIDER_SITE_OTHER): Payer: BLUE CROSS/BLUE SHIELD | Admitting: Family Medicine

## 2015-03-04 VITALS — BP 120/80 | HR 89 | Temp 98.8°F | Resp 18 | Ht 67.0 in | Wt 199.4 lb

## 2015-03-04 DIAGNOSIS — G8929 Other chronic pain: Secondary | ICD-10-CM

## 2015-03-04 DIAGNOSIS — M5416 Radiculopathy, lumbar region: Secondary | ICD-10-CM | POA: Diagnosis not present

## 2015-03-04 MED ORDER — OXYCODONE HCL ER 30 MG PO T12A: 30.0000 mg | EXTENDED_RELEASE_TABLET | Freq: Two times a day (BID) | ORAL | Status: DC

## 2015-03-04 MED ORDER — PERCOCET 10-325 MG PO TABS: 1.0000 | ORAL_TABLET | Freq: Three times a day (TID) | ORAL | Status: DC | PRN

## 2015-03-04 NOTE — Progress Notes (Signed)
Name: Michael Giles   MRN: [id number]    DOB: [birth year]   Date:03/04/2015       Progress Note  Subjective  Chief Complaint  Chief Complaint  Patient presents with  . Follow-up    4 wk   . Hyperlipidemia  . Medication Refill    oxycodone 30 mg / percocet 10-325 mg    Back Pain This is Giles chronic problem. The problem is unchanged. The pain is present in the lumbar spine and gluteal. The quality of the pain is described as burning and stabbing. The pain radiates to the left knee and left thigh. The pain is at Giles severity of 8/10. The pain is severe. The symptoms are aggravated by position (walking). Associated symptoms include numbness (numbness and burning in left leg down to his left knee). Pertinent negatives include no bladder incontinence, bowel incontinence, dysuria or fever. He has tried analgesics and NSAIDs (Pt. has an appointment with Pain Clinic on [facility] 15th, 2016.) for the symptoms. The treatment provided significant relief.    Past Medical History  Diagnosis Date  . Impotence of organic origin   . Opioid type dependence, continuous   . Postlaminectomy syndrome, lumbar region   . Spasm of muscle   . Other symptoms involving cardiovascular system   . Degeneration of lumbar or lumbosacral intervertebral disc   . Unspecified vitamin D deficiency   . Hypertension   . Hyperlipidemia   . Panic disorder without agoraphobia   . History of angioplasty   . Arthritis     Past Surgical History  Procedure Laterality Date  . Laminectomy    . Inguinal hernia repair  2003  . Arthrodesis  2003  . Cardiac catheterization  2010    [facility]/65% blockage left leg no stent  . Back surgery      Family History  Problem Relation Age of Onset  . Heart disease Father   . Heart attack Father     Social History   Social History  . Marital Status: Married    Spouse Name: N/Giles  . Number of Children: N/Giles  . Years of Education: N/Giles   Occupational History  . Not on file.   Social  History Main Topics  . Smoking status: Current Every Day Smoker -- 2.00 packs/day for 32 years    Types: Cigarettes  . Smokeless tobacco: Not on file  . Alcohol Use: No  . Drug Use: No  . Sexual Activity: Not on file   Other Topics Concern  . Not on file   Social History Narrative     Current outpatient prescriptions:  .  aspirin 81 MG tablet, Take 81 mg by mouth daily., Disp: , Rfl:  .  cilostazol (PLETAL) 100 MG tablet, Take 100 mg by mouth 2 (two) times daily., Disp: , Rfl:  .  lisinopril (PRINIVIL,ZESTRIL) 20 MG tablet, Take 1 tablet (20 mg total) by mouth daily., Disp: 30 tablet, Rfl: 2 .  OxyCODONE HCl ER 30 MG T12A, Take 30 mg by mouth every 12 (twelve) hours., Disp: 60 each, Rfl: 0 .  PERCOCET 10-325 MG per tablet, Take 1 tablet by mouth every 8 (eight) hours as needed for pain., Disp: 90 tablet, Rfl: 0 .  tadalafil (CIALIS) 10 MG tablet, Take 10 mg by mouth daily as needed for erectile dysfunction. , Disp: , Rfl:   Allergies  Allergen Reactions  . Crestor [Rosuvastatin]     joint pain     Review of Systems  Constitutional: Negative  for fever.  Gastrointestinal: Negative for bowel incontinence.  Genitourinary: Negative for bladder incontinence and dysuria.  Musculoskeletal: Positive for back pain.  Neurological: Positive for numbness (numbness and burning in left leg down to his left knee).   Objective  Filed Vitals:   03/04/15 0812  BP: 120/80  Pulse: 89  Temp: 98.8 F (37.1 C)  TempSrc: Oral  Resp: 18  Height: 5\' 7"  (1.702 m)  Weight: 199 lb 6.4 oz (90.447 kg)  SpO2: 97%    Physical Exam  Constitutional: He is oriented to person, place, and time and well-developed, well-nourished, and in no distress.  Cardiovascular: Normal rate and regular rhythm.   Pulmonary/Chest: Effort normal and breath sounds normal.  Musculoskeletal:       Lumbar back: He exhibits tenderness and pain. He exhibits no spasm.       Back:  Neurological: He is alert and oriented  to person, place, and time.  Nursing note and vitals reviewed.   Assessment & Plan  1. Chronic radicular lumbar pain Patient is going to establish with pain clinic on 03/24/2015. He has chronic radicular low back pain and is on opioid therapy. Patient understands the dependence potential, interactions and side effects of opioids. He will follow-up in 3 months.  - OxyCODONE HCl ER 30 MG T12A; Take 30 mg by mouth every 12 (twelve) hours.  Dispense: 60 each; Refill: 0 - PERCOCET 10-325 MG per tablet; Take 1 tablet by mouth every 8 (eight) hours as needed for pain.  Dispense: 90 tablet; Refill: 0   Michael Giles. Faylene Kurtz [facility] 03/04/2015 8:28 AM

## 2015-03-31 ENCOUNTER — Ambulatory Visit (INDEPENDENT_AMBULATORY_CARE_PROVIDER_SITE_OTHER): Payer: BLUE CROSS/BLUE SHIELD | Admitting: Family Medicine

## 2015-03-31 ENCOUNTER — Encounter: Payer: Self-pay | Admitting: Family Medicine

## 2015-03-31 VITALS — BP 131/80 | HR 83 | Temp 98.7°F | Resp 18 | Ht 67.0 in | Wt 200.5 lb

## 2015-03-31 DIAGNOSIS — M5416 Radiculopathy, lumbar region: Secondary | ICD-10-CM | POA: Diagnosis not present

## 2015-03-31 DIAGNOSIS — Z23 Encounter for immunization: Secondary | ICD-10-CM | POA: Diagnosis not present

## 2015-03-31 DIAGNOSIS — G8929 Other chronic pain: Secondary | ICD-10-CM | POA: Diagnosis not present

## 2015-03-31 DIAGNOSIS — R208 Other disturbances of skin sensation: Secondary | ICD-10-CM | POA: Insufficient documentation

## 2015-03-31 MED ORDER — PERCOCET 10-325 MG PO TABS: 1.0000 | ORAL_TABLET | Freq: Three times a day (TID) | ORAL | Status: DC | PRN

## 2015-03-31 MED ORDER — OXYCODONE HCL ER 30 MG PO T12A: 30.0000 mg | EXTENDED_RELEASE_TABLET | Freq: Two times a day (BID) | ORAL | Status: DC

## 2015-03-31 NOTE — Progress Notes (Signed)
Name: Michael Giles   MRN: [id number]    DOB: 29-Aug-1960   Date:03/31/2015       Progress Note  Subjective  Chief Complaint  Chief Complaint  Patient presents with  . Medication Refill  . Hyperlipidemia    Back Pain This is a chronic problem. The problem is unchanged. The pain is present in the lumbar spine. The quality of the pain is described as burning and shooting. The pain is at a severity of 8/10. The symptoms are aggravated by position and standing. Associated symptoms include leg pain (burning and stinging in left leg). Pertinent negatives include no bladder incontinence or bowel incontinence. Treatments tried: last back injection 4 months ago.    Past Medical History  Diagnosis Date  . Impotence of organic origin   . Opioid type dependence, continuous   . Postlaminectomy syndrome, lumbar region   . Spasm of muscle   . Other symptoms involving cardiovascular system   . Degeneration of lumbar or lumbosacral intervertebral disc   . Unspecified vitamin D deficiency   . Hypertension   . Hyperlipidemia   . Panic disorder without agoraphobia   . History of angioplasty   . Arthritis     Past Surgical History  Procedure Laterality Date  . Laminectomy    . Inguinal hernia repair  2003  . Arthrodesis  2003  . Cardiac catheterization  2010    [facility]/65% blockage left leg no stent  . Back surgery      Family History  Problem Relation Age of Onset  . Heart disease Father   . Heart attack Father     Social History   Social History  . Marital Status: Married    Spouse Name: N/A  . Number of Children: N/A  . Years of Education: N/A   Occupational History  . Not on file.   Social History Main Topics  . Smoking status: Current Every Day Smoker -- 2.00 packs/day for 32 years    Types: Cigarettes  . Smokeless tobacco: Not on file  . Alcohol Use: No  . Drug Use: No  . Sexual Activity: Not on file   Other Topics Concern  . Not on file   Social History Narrative     Current outpatient prescriptions:  .  aspirin 81 MG tablet, Take 81 mg by mouth daily., Disp: , Rfl:  .  cilostazol (PLETAL) 100 MG tablet, Take 100 mg by mouth 2 (two) times daily., Disp: , Rfl:  .  lisinopril (PRINIVIL,ZESTRIL) 20 MG tablet, Take 1 tablet (20 mg total) by mouth daily., Disp: 30 tablet, Rfl: 2 .  OxyCODONE HCl ER 30 MG T12A, Take 30 mg by mouth every 12 (twelve) hours., Disp: 60 each, Rfl: 0 .  PERCOCET 10-325 MG per tablet, Take 1 tablet by mouth every 8 (eight) hours as needed for pain., Disp: 90 tablet, Rfl: 0 .  tadalafil (CIALIS) 10 MG tablet, Take 10 mg by mouth daily as needed for erectile dysfunction. , Disp: , Rfl:   Allergies  Allergen Reactions  . Crestor [Rosuvastatin]     joint pain   Review of Systems  Gastrointestinal: Negative for bowel incontinence.  Genitourinary: Negative for bladder incontinence.  Musculoskeletal: Positive for back pain.   Objective  Filed Vitals:   03/31/15 0837  BP: 131/80  Pulse: 83  Temp: 98.7 F (37.1 C)  TempSrc: Oral  Resp: 18  Height:  (1.702 m)  Weight: 200 lb 8 oz (90.946 kg)  SpO2: 98%  Physical Exam  Constitutional: He is well-developed, well-nourished, and in no distress.  Cardiovascular: Normal rate and regular rhythm.   Pulmonary/Chest: Effort normal and breath sounds normal.  Musculoskeletal:       Lumbar back: He exhibits pain. He exhibits no spasm.  Nursing note and vitals reviewed.  Assessment & Plan  1. Need for immunization against influenza  - Flu Vaccine QUAD 36+ mos IM  2. Chronic radicular lumbar pain  Patient is going to follow up with pain management, starting from October 20th. He is taking OxyContin scheduled and Percocet as needed for chronic lumbar pain. He is aware of the dependence potential and side effects of opioids. Refills provided and follow-up in one month.  - OxyCODONE HCl ER 30 MG T12A; Take 30 mg by mouth every 12 (twelve) hours.  Dispense: 60 each; Refill:  0 - PERCOCET 10-325 MG per tablet; Take 1 tablet by mouth every 8 (eight) hours as needed for pain.  Dispense: 90 tablet; Refill: 0    Syed Asad A. Faylene Kurtz Medical [facility] 03/31/2015 8:53 AM

## 2015-04-12 ENCOUNTER — Ambulatory Visit: Payer: BLUE CROSS/BLUE SHIELD | Admitting: Family Medicine

## 2015-04-29 ENCOUNTER — Ambulatory Visit (INDEPENDENT_AMBULATORY_CARE_PROVIDER_SITE_OTHER): Payer: BLUE CROSS/BLUE SHIELD | Admitting: Family Medicine

## 2015-04-29 ENCOUNTER — Encounter: Payer: Self-pay | Admitting: Family Medicine

## 2015-04-29 VITALS — BP 131/77 | HR 86 | Temp 98.5°F | Resp 18 | Ht 67.0 in | Wt 194.6 lb

## 2015-04-29 DIAGNOSIS — G8929 Other chronic pain: Secondary | ICD-10-CM

## 2015-04-29 DIAGNOSIS — M5416 Radiculopathy, lumbar region: Secondary | ICD-10-CM | POA: Diagnosis not present

## 2015-04-29 MED ORDER — OXYCODONE HCL ER 30 MG PO T12A: 30.0000 mg | EXTENDED_RELEASE_TABLET | Freq: Two times a day (BID) | ORAL | Status: DC

## 2015-04-29 MED ORDER — PERCOCET 10-325 MG PO TABS: 1.0000 | ORAL_TABLET | Freq: Three times a day (TID) | ORAL | Status: DC | PRN
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
## 2015-05-30 ENCOUNTER — Ambulatory Visit: Payer: BLUE CROSS/BLUE SHIELD | Admitting: Family Medicine

## 2015-06-06 ENCOUNTER — Ambulatory Visit: Payer: BLUE CROSS/BLUE SHIELD | Admitting: Family Medicine

## 2015-10-14 DIAGNOSIS — M5416 Radiculopathy, lumbar region: Secondary | ICD-10-CM | POA: Diagnosis not present

## 2015-10-14 DIAGNOSIS — M545 Low back pain: Secondary | ICD-10-CM | POA: Diagnosis not present

## 2015-10-14 DIAGNOSIS — Z79891 Long term (current) use of opiate analgesic: Secondary | ICD-10-CM | POA: Diagnosis not present

## 2015-10-14 DIAGNOSIS — G894 Chronic pain syndrome: Secondary | ICD-10-CM | POA: Diagnosis not present

## 2015-10-14 DIAGNOSIS — M47816 Spondylosis without myelopathy or radiculopathy, lumbar region: Secondary | ICD-10-CM | POA: Diagnosis not present

## 2015-12-08 DIAGNOSIS — M545 Low back pain: Secondary | ICD-10-CM | POA: Diagnosis not present

## 2015-12-08 DIAGNOSIS — Z79891 Long term (current) use of opiate analgesic: Secondary | ICD-10-CM | POA: Diagnosis not present

## 2015-12-08 DIAGNOSIS — M5416 Radiculopathy, lumbar region: Secondary | ICD-10-CM | POA: Diagnosis not present

## 2015-12-08 DIAGNOSIS — G894 Chronic pain syndrome: Secondary | ICD-10-CM | POA: Diagnosis not present

## 2016-04-02 DIAGNOSIS — Z6830 Body mass index (BMI) 30.0-30.9, adult: Secondary | ICD-10-CM | POA: Diagnosis not present

## 2016-04-02 DIAGNOSIS — M5416 Radiculopathy, lumbar region: Secondary | ICD-10-CM | POA: Diagnosis not present

## 2016-04-02 DIAGNOSIS — G894 Chronic pain syndrome: Secondary | ICD-10-CM | POA: Diagnosis not present

## 2016-04-02 DIAGNOSIS — Z79891 Long term (current) use of opiate analgesic: Secondary | ICD-10-CM | POA: Diagnosis not present

## 2016-04-02 DIAGNOSIS — F17200 Nicotine dependence, unspecified, uncomplicated: Secondary | ICD-10-CM | POA: Diagnosis not present

## 2016-04-02 DIAGNOSIS — M545 Low back pain: Secondary | ICD-10-CM | POA: Diagnosis not present

## 2016-04-02 DIAGNOSIS — Z720 Tobacco use: Secondary | ICD-10-CM | POA: Diagnosis not present

## 2016-04-02 DIAGNOSIS — Z716 Tobacco abuse counseling: Secondary | ICD-10-CM | POA: Diagnosis not present

## 2016-04-13 ENCOUNTER — Ambulatory Visit: Payer: BLUE CROSS/BLUE SHIELD | Admitting: Family Medicine

## 2016-04-20 ENCOUNTER — Encounter: Payer: Self-pay | Admitting: Family Medicine

## 2016-04-20 ENCOUNTER — Ambulatory Visit (INDEPENDENT_AMBULATORY_CARE_PROVIDER_SITE_OTHER): Payer: BLUE CROSS/BLUE SHIELD | Admitting: Family Medicine

## 2016-04-20 VITALS — BP 131/88 | HR 85 | Temp 98.3°F | Resp 17 | Ht 67.0 in | Wt 201.0 lb

## 2016-04-20 DIAGNOSIS — Z833 Family history of diabetes mellitus: Secondary | ICD-10-CM

## 2016-04-20 DIAGNOSIS — Z23 Encounter for immunization: Secondary | ICD-10-CM | POA: Diagnosis not present

## 2016-04-20 DIAGNOSIS — I10 Essential (primary) hypertension: Secondary | ICD-10-CM

## 2016-04-20 DIAGNOSIS — I739 Peripheral vascular disease, unspecified: Secondary | ICD-10-CM

## 2016-04-20 DIAGNOSIS — H6123 Impacted cerumen, bilateral: Secondary | ICD-10-CM | POA: Diagnosis not present

## 2016-04-20 LAB — LIPID PANEL
CHOL/HDL RATIO: 5.2 ratio — AB (ref <=5.0)
CHOLESTEROL: 181 mg/dL (ref 125–200)
HDL: 35 mg/dL — AB (ref >=40)
LDL Cholesterol: 122 mg/dL (ref <130)
TRIGLYCERIDES: 122 mg/dL (ref <150)
VLDL: 24 mg/dL (ref <30)

## 2016-04-20 LAB — COMPLETE METABOLIC PANEL WITH GFR
ALBUMIN: 3.9 g/dL (ref 3.6–5.1)
ALK PHOS: 91 U/L (ref 40–115)
ALT: 7 U/L — ABNORMAL LOW (ref 9–46)
AST: 12 U/L (ref 10–35)
BILIRUBIN TOTAL: 0.4 mg/dL (ref 0.2–1.2)
BUN: 22 mg/dL (ref 7–25)
CALCIUM: 9.1 mg/dL (ref 8.6–10.3)
CO2: 29 mmol/L (ref 20–31)
Chloride: 101 mmol/L (ref 98–110)
Creat: 1.1 mg/dL (ref 0.70–1.33)
GFR, EST AFRICAN AMERICAN: 88 mL/min (ref >=60)
GFR, EST NON AFRICAN AMERICAN: 76 mL/min (ref >=60)
Glucose, Bld: 92 mg/dL (ref 65–99)
POTASSIUM: 4.6 mmol/L (ref 3.5–5.3)
SODIUM: 137 mmol/L (ref 135–146)
TOTAL PROTEIN: 6.6 g/dL (ref 6.1–8.1)

## 2016-04-20 LAB — POCT GLYCOSYLATED HEMOGLOBIN (HGB A1C): HEMOGLOBIN A1C: 6.7

## 2016-04-20 MED ORDER — CILOSTAZOL 100 MG PO TABS: 100.0000 mg | ORAL_TABLET | Freq: Two times a day (BID) | ORAL | 1 refills | Status: DC

## 2016-04-20 MED ORDER — LISINOPRIL 20 MG PO TABS: 20.0000 mg | ORAL_TABLET | Freq: Every day | ORAL | 1 refills | Status: DC

## 2016-04-20 NOTE — Progress Notes (Signed)
Name: Michael Giles   MRN: [id number]    DOB: 10-May-1961   Date:04/20/2016       Progress Note  Subjective  Chief Complaint  Chief Complaint  Patient presents with  . Medication Refill    Hypertension  This is a chronic problem. The problem is controlled. Pertinent negatives include no blurred vision, chest pain, headaches, palpitations or shortness of breath. Past treatments include ACE inhibitors. There is no history of kidney disease, CAD/MI or CVA.   Peripheral Vascular Disease: Pt. Has history of Peripheral Vascular disease, has a clot reportedly in his left leg, no claudication symptoms. He is on Cilostazol 100 mg po BID, has not taken Cilostazol in 6 months, requesting refills.   Patient also reports left ear fullness and wants to get that checked out, denies any fevers, chills, or ear pain.   Past Medical History:  Diagnosis Date  . Arthritis   . Degeneration of lumbar or lumbosacral intervertebral disc   . History of angioplasty   . Hyperlipidemia   . Hypertension   . Impotence of organic origin   . Opioid type dependence, continuous (HCC)   . Other symptoms involving cardiovascular system   . Panic disorder without agoraphobia   . Postlaminectomy syndrome, lumbar region   . Spasm of muscle   . Unspecified vitamin D deficiency     Past Surgical History:  Procedure Laterality Date  . ARTHRODESIS  2003  . BACK SURGERY    . CARDIAC CATHETERIZATION  2010   [facility]/65% blockage left leg no stent  . INGUINAL HERNIA REPAIR  2003  . LAMINECTOMY      Family History  Problem Relation Age of Onset  . Heart disease Father   . Heart attack Father     Social History   Social History  . Marital status: Married    Spouse name: N/A  . Number of children: N/A  . Years of education: N/A   Occupational History  . Not on file.   Social History Main Topics  . Smoking status: Current Every Day Smoker    Packs/day: 2.00    Years: 32.00    Types: Cigarettes  .  Smokeless tobacco: Never Used  . Alcohol use No  . Drug use: No  . Sexual activity: Not on file   Other Topics Concern  . Not on file   Social History Narrative  . No narrative on file     Current Outpatient Prescriptions:  .  aspirin 81 MG tablet, Take 81 mg by mouth daily., Disp: , Rfl:  .  cilostazol (PLETAL) 100 MG tablet, Take 100 mg by mouth 2 (two) times daily., Disp: , Rfl:  .  lisinopril (PRINIVIL,ZESTRIL) 20 MG tablet, Take 1 tablet (20 mg total) by mouth daily., Disp: 30 tablet, Rfl: 2 .  OxyCODONE HCl ER 30 MG T12A, Take 30 mg by mouth every 12 (twelve) hours., Disp: 60 each, Rfl: 0 .  PERCOCET 10-325 MG tablet, Take 1 tablet by mouth every 8 (eight) hours as needed for pain., Disp: 90 tablet, Rfl: 0 .  tadalafil (CIALIS) 10 MG tablet, Take 10 mg by mouth daily as needed for erectile dysfunction. , Disp: , Rfl:   Allergies  Allergen Reactions  . Crestor [Rosuvastatin]     joint pain     Review of Systems  HENT: Negative for ear discharge and ear pain.   Eyes: Negative for blurred vision.  Respiratory: Negative for shortness of breath.   Cardiovascular: Negative for  chest pain and palpitations.  Neurological: Negative for headaches.     Objective  Vitals:   04/20/16 0921  BP: 131/88  Pulse: 85  Resp: 17  Temp: 98.3 F (36.8 C)  TempSrc: Oral  SpO2: 97%  Weight: 201 lb (91.2 kg)  Height: 5\' 7"  (1.702 m)    Physical Exam  Constitutional: He is oriented to person, place, and time and well-developed, well-nourished, and in no distress.  HENT:  Head: Normocephalic and atraumatic.  Left ear ceumen impaction, unable to visualize TM  Cardiovascular: Normal rate, regular rhythm, S1 normal, S2 normal and normal heart sounds.   No murmur heard. Pulmonary/Chest: Effort normal and breath sounds normal. He has no rhonchi.  Abdominal: Soft. Bowel sounds are normal. There is no tenderness.  Musculoskeletal:       Left ankle: He exhibits no swelling and normal  pulse. No tenderness.  Neurological: He is alert and oriented to person, place, and time.  Skin: Skin is warm, dry and intact.  Psychiatric: Mood, memory, affect and judgment normal.  Nursing note and vitals reviewed.   Assessment & Plan  1. Essential hypertension Diastolic blood pressure elevated, patient advised to restart on lisinopril, prescription provided - lisinopril (PRINIVIL,ZESTRIL) 20 MG tablet; Take 1 tablet (20 mg total) by mouth daily.  Dispense: 90 tablet; Refill: 1  2. Peripheral vascular disease (HCC) No report of claudication although patient does have chronic low back pain and has numbness in the left thigh, advised to follow up with vascular surgery, refill for cilostazol provided  cilostazol (PLETAL) 100 MG tablet; Take 1 tablet (100 mg total) by mouth 2 (two) times daily.  Dispense: 180 tablet; Refill: 1 - Lipid Profile - COMPLETE METABOLIC PANEL WITH GFR  3. Family history of diabetes mellitus in father A1c is 6.7%, consistent with well-controlled diabetes, advised to institute dietary and lifestyle changes, no pharmacotherapy indicated, recheck in 3 months - POCT HgB A1C  4. Need for influenza vaccination  - Flu Vaccine QUAD 36+ mos PF IM (Fluarix & Fluzone Quad PF)  5. Bilateral impacted cerumen  - Ear Lavage   Carlethia Mesquita Asad A. Faylene [facility]zSh[facility] 04/20/2016 9:33 AM

## 2016-04-30 ENCOUNTER — Telehealth: Payer: Self-pay | Admitting: Family Medicine

## 2016-04-30 NOTE — Telephone Encounter (Signed)
Pt needs results

## 2016-06-28 DIAGNOSIS — G894 Chronic pain syndrome: Secondary | ICD-10-CM | POA: Diagnosis not present

## 2016-06-28 DIAGNOSIS — Z716 Tobacco abuse counseling: Secondary | ICD-10-CM | POA: Diagnosis not present

## 2016-06-28 DIAGNOSIS — Z87891 Personal history of nicotine dependence: Secondary | ICD-10-CM | POA: Diagnosis not present

## 2016-06-28 DIAGNOSIS — Z720 Tobacco use: Secondary | ICD-10-CM | POA: Diagnosis not present

## 2016-06-28 DIAGNOSIS — F17200 Nicotine dependence, unspecified, uncomplicated: Secondary | ICD-10-CM | POA: Diagnosis not present

## 2016-06-28 DIAGNOSIS — M545 Low back pain: Secondary | ICD-10-CM | POA: Diagnosis not present

## 2016-06-28 DIAGNOSIS — M5416 Radiculopathy, lumbar region: Secondary | ICD-10-CM | POA: Diagnosis not present

## 2016-06-28 DIAGNOSIS — Z79891 Long term (current) use of opiate analgesic: Secondary | ICD-10-CM | POA: Diagnosis not present

## 2016-07-23 ENCOUNTER — Ambulatory Visit: Payer: BLUE CROSS/BLUE SHIELD | Admitting: Family Medicine

## 2016-07-30 ENCOUNTER — Encounter: Payer: Self-pay | Admitting: Family Medicine

## 2016-07-30 ENCOUNTER — Ambulatory Visit (INDEPENDENT_AMBULATORY_CARE_PROVIDER_SITE_OTHER): Payer: BLUE CROSS/BLUE SHIELD | Admitting: Family Medicine

## 2016-07-30 VITALS — BP 128/71 | HR 87 | Temp 98.8°F | Resp 16 | Ht 67.0 in | Wt 202.8 lb

## 2016-07-30 DIAGNOSIS — E119 Type 2 diabetes mellitus without complications: Secondary | ICD-10-CM | POA: Insufficient documentation

## 2016-07-30 DIAGNOSIS — E782 Mixed hyperlipidemia: Secondary | ICD-10-CM | POA: Diagnosis not present

## 2016-07-30 DIAGNOSIS — I10 Essential (primary) hypertension: Secondary | ICD-10-CM | POA: Diagnosis not present

## 2016-07-30 LAB — POCT GLYCOSYLATED HEMOGLOBIN (HGB A1C): Hemoglobin A1C: 5.9

## 2016-07-30 NOTE — Progress Notes (Signed)
Name: Michael Giles   MRN: [id number]    DOB: June 12, 1961   Date:07/30/2016       Progress Note  Subjective  Chief Complaint  Chief Complaint  Patient presents with  . Follow-up    3 mo    Hypertension  This is a chronic problem. The problem is unchanged. The problem is controlled. Pertinent negatives include no blurred vision (right eye has a white blur,), chest pain, headaches, palpitations or shortness of breath. Past treatments include ACE inhibitors. There is no history of kidney disease, CAD/MI or CVA.  Diabetes  He presents for his follow-up diabetic visit. He has type 2 diabetes mellitus. His disease course has been improving. Pertinent negatives for hypoglycemia include no headaches. Pertinent negatives for diabetes include no blurred vision (right eye has a white blur,), no chest pain, no fatigue, no polydipsia and no polyuria. Pertinent negatives for diabetic complications include no CVA. Risk factors for coronary artery disease include diabetes mellitus. Current diabetic treatment includes diet. He is following a diabetic diet. Frequency home blood tests: has not been checking his blood glucose regularly.  Hyperlipidemia  This is a chronic problem. The problem is uncontrolled. Recent lipid tests were reviewed and are high. Pertinent negatives include no chest pain, leg pain, myalgias or shortness of breath. Current antihyperlipidemic treatment includes statins.    Past Medical History:  Diagnosis Date  . Arthritis   . Degeneration of lumbar or lumbosacral intervertebral disc   . History of angioplasty   . Hyperlipidemia   . Hypertension   . Impotence of organic origin   . Opioid type dependence, continuous (HCC)   . Other symptoms involving cardiovascular system   . Panic disorder without agoraphobia   . Postlaminectomy syndrome, lumbar region   . Spasm of muscle   . Unspecified vitamin D deficiency     Past Surgical History:  Procedure Laterality Date  . ARTHRODESIS   2003  . BACK SURGERY    . CARDIAC CATHETERIZATION  2010   [facility]/65% blockage left leg no stent  . INGUINAL HERNIA REPAIR  2003  . LAMINECTOMY      Family History  Problem Relation Age of Onset  . Heart disease Father   . Heart attack Father     Social History   Social History  . Marital status: Married    Spouse name: N/A  . Number of children: N/A  . Years of education: N/A   Occupational History  . Not on file.   Social History Main Topics  . Smoking status: Current Every Day Smoker    Packs/day: 2.00    Years: 32.00    Types: Cigarettes  . Smokeless tobacco: Never Used  . Alcohol use No  . Drug use: No  . Sexual activity: Not on file   Other Topics Concern  . Not on file   Social History Narrative  . No narrative on file     Current Outpatient Prescriptions:  .  aspirin 81 MG tablet, Take 81 mg by mouth daily., Disp: , Rfl:  .  cilostazol (PLETAL) 100 MG tablet, Take 1 tablet (100 mg total) by mouth 2 (two) times daily., Disp: 180 tablet, Rfl: 1 .  lisinopril (PRINIVIL,ZESTRIL) 20 MG tablet, Take 1 tablet (20 mg total) by mouth daily., Disp: 90 tablet, Rfl: 1 .  OxyCODONE HCl ER 30 MG T12A, Take 30 mg by mouth every 12 (twelve) hours., Disp: 60 each, Rfl: 0 .  PERCOCET 10-325 MG tablet, Take 1 tablet  by mouth every 8 (eight) hours as needed for pain., Disp: 90 tablet, Rfl: 0 .  tadalafil (CIALIS) 10 MG tablet, Take 10 mg by mouth daily as needed for erectile dysfunction. , Disp: , Rfl:   Allergies  Allergen Reactions  . Crestor [Rosuvastatin]     joint pain     Review of Systems  Constitutional: Negative for fatigue.  Eyes: Negative for blurred vision (right eye has a white blur,).  Respiratory: Negative for shortness of breath.   Cardiovascular: Negative for chest pain and palpitations.  Musculoskeletal: Negative for myalgias.  Neurological: Negative for headaches.  Endo/Heme/Allergies: Negative for polydipsia.    Objective  Vitals:   07/30/16  1533  BP: 128/71  Pulse: 87  Resp: 16  Temp: 98.8 F (37.1 C)  TempSrc: Oral  SpO2: 97%  Weight: 202 lb 12.8 oz (92 kg)  Height: 5\' 7"  (1.702 m)    Physical Exam  Constitutional: He is oriented to person, place, and time and well-developed, well-nourished, and in no distress.  HENT:  Head: Normocephalic and atraumatic.  Cardiovascular: Normal rate, regular rhythm and normal heart sounds.   No murmur heard. Pulmonary/Chest: Effort normal and breath sounds normal. He has no wheezes.  Abdominal: Soft. Bowel sounds are normal.  Musculoskeletal: He exhibits no edema.  Neurological: He is alert and oriented to person, place, and time.  Psychiatric: Mood, memory, affect and judgment normal.  Nursing note and vitals reviewed.      Assessment & Plan  1. Mixed hyperlipidemia 10 FLP and consider starting on statin therapy - Lipid Profile - COMPLETE METABOLIC PANEL WITH GFR  2. Essential hypertension BP stable on present anti- hypertensive therapy  3. Controlled type 2 diabetes mellitus without complication, without long-term current use of insulin (HCC) A1c is 5.9%, well-controlled diabetes mellitus. - POCT HgB A1C  Vy Badley Asad A. Faylene KurtzShah [facility] 07/30/2016 3:53 PM

## 2016-08-06 DIAGNOSIS — E782 Mixed hyperlipidemia: Secondary | ICD-10-CM | POA: Diagnosis not present

## 2016-08-06 LAB — COMPLETE METABOLIC PANEL WITH GFR
ALT: 12 U/L (ref 9–46)
AST: 14 U/L (ref 10–35)
Albumin: 4.1 g/dL (ref 3.6–5.1)
Alkaline Phosphatase: 95 U/L (ref 40–115)
BILIRUBIN TOTAL: 0.4 mg/dL (ref 0.2–1.2)
BUN: 19 mg/dL (ref 7–25)
CALCIUM: 9.1 mg/dL (ref 8.6–10.3)
CHLORIDE: 103 mmol/L (ref 98–110)
CO2: 29 mmol/L (ref 20–31)
CREATININE: 1 mg/dL (ref 0.70–1.33)
GFR, Est African American: >89 mL/min (ref >=60)
GFR, Est Non African American: 85 mL/min (ref >=60)
Glucose, Bld: 95 mg/dL (ref 65–99)
Potassium: 5.2 mmol/L (ref 3.5–5.3)
Sodium: 137 mmol/L (ref 135–146)
TOTAL PROTEIN: 6.9 g/dL (ref 6.1–8.1)

## 2016-08-06 LAB — LIPID PANEL
CHOLESTEROL: 179 mg/dL (ref <200)
HDL: 43 mg/dL (ref >40)
LDL Cholesterol: 124 mg/dL — ABNORMAL HIGH (ref <100)
Total CHOL/HDL Ratio: 4.2 Ratio (ref <5.0)
Triglycerides: 59 mg/dL (ref <150)
VLDL: 12 mg/dL (ref <30)

## 2016-09-05 ENCOUNTER — Ambulatory Visit: Payer: BLUE CROSS/BLUE SHIELD | Admitting: Family Medicine

## 2016-09-17 DIAGNOSIS — G894 Chronic pain syndrome: Secondary | ICD-10-CM | POA: Diagnosis not present

## 2016-09-17 DIAGNOSIS — M545 Low back pain: Secondary | ICD-10-CM | POA: Diagnosis not present

## 2016-09-17 DIAGNOSIS — M5416 Radiculopathy, lumbar region: Secondary | ICD-10-CM | POA: Diagnosis not present

## 2016-09-17 DIAGNOSIS — Z87891 Personal history of nicotine dependence: Secondary | ICD-10-CM | POA: Diagnosis not present

## 2016-09-17 DIAGNOSIS — F17200 Nicotine dependence, unspecified, uncomplicated: Secondary | ICD-10-CM | POA: Diagnosis not present

## 2016-09-17 DIAGNOSIS — Z720 Tobacco use: Secondary | ICD-10-CM | POA: Diagnosis not present

## 2016-09-17 DIAGNOSIS — Z716 Tobacco abuse counseling: Secondary | ICD-10-CM | POA: Diagnosis not present

## 2016-09-17 DIAGNOSIS — Z79891 Long term (current) use of opiate analgesic: Secondary | ICD-10-CM | POA: Diagnosis not present

## 2016-10-29 ENCOUNTER — Ambulatory Visit: Payer: BLUE CROSS/BLUE SHIELD | Admitting: Family Medicine

## 2016-11-09 ENCOUNTER — Ambulatory Visit: Payer: BLUE CROSS/BLUE SHIELD | Admitting: Family Medicine

## 2016-11-19 DIAGNOSIS — Z79891 Long term (current) use of opiate analgesic: Secondary | ICD-10-CM | POA: Diagnosis not present

## 2016-11-19 DIAGNOSIS — Z720 Tobacco use: Secondary | ICD-10-CM | POA: Diagnosis not present

## 2016-11-19 DIAGNOSIS — G894 Chronic pain syndrome: Secondary | ICD-10-CM | POA: Diagnosis not present

## 2016-11-19 DIAGNOSIS — Z716 Tobacco abuse counseling: Secondary | ICD-10-CM | POA: Diagnosis not present

## 2016-11-19 DIAGNOSIS — M5416 Radiculopathy, lumbar region: Secondary | ICD-10-CM | POA: Diagnosis not present

## 2016-11-19 DIAGNOSIS — F17200 Nicotine dependence, unspecified, uncomplicated: Secondary | ICD-10-CM | POA: Diagnosis not present

## 2016-11-19 DIAGNOSIS — Z87891 Personal history of nicotine dependence: Secondary | ICD-10-CM | POA: Diagnosis not present

## 2016-11-19 DIAGNOSIS — M545 Low back pain: Secondary | ICD-10-CM | POA: Diagnosis not present

## 2016-11-20 ENCOUNTER — Ambulatory Visit: Payer: BLUE CROSS/BLUE SHIELD | Admitting: Family Medicine

## 2017-02-14 DIAGNOSIS — M545 Low back pain: Secondary | ICD-10-CM | POA: Diagnosis not present

## 2017-02-14 DIAGNOSIS — Z79891 Long term (current) use of opiate analgesic: Secondary | ICD-10-CM | POA: Diagnosis not present

## 2017-02-14 DIAGNOSIS — Z87891 Personal history of nicotine dependence: Secondary | ICD-10-CM | POA: Diagnosis not present

## 2017-02-14 DIAGNOSIS — Z716 Tobacco abuse counseling: Secondary | ICD-10-CM | POA: Diagnosis not present

## 2017-02-14 DIAGNOSIS — M5416 Radiculopathy, lumbar region: Secondary | ICD-10-CM | POA: Diagnosis not present

## 2017-02-14 DIAGNOSIS — G894 Chronic pain syndrome: Secondary | ICD-10-CM | POA: Diagnosis not present

## 2017-02-14 DIAGNOSIS — Z5181 Encounter for therapeutic drug level monitoring: Secondary | ICD-10-CM | POA: Diagnosis not present

## 2017-02-14 DIAGNOSIS — Z720 Tobacco use: Secondary | ICD-10-CM | POA: Diagnosis not present

## 2017-02-14 DIAGNOSIS — F17200 Nicotine dependence, unspecified, uncomplicated: Secondary | ICD-10-CM | POA: Diagnosis not present

## 2017-03-22 DIAGNOSIS — H2513 Age-related nuclear cataract, bilateral: Secondary | ICD-10-CM | POA: Diagnosis not present

## 2017-04-05 DIAGNOSIS — H2589 Other age-related cataract: Secondary | ICD-10-CM | POA: Diagnosis not present

## 2017-04-10 ENCOUNTER — Encounter: Payer: Self-pay | Admitting: *Deleted

## 2017-04-15 DIAGNOSIS — Z720 Tobacco use: Secondary | ICD-10-CM | POA: Diagnosis not present

## 2017-04-15 DIAGNOSIS — G894 Chronic pain syndrome: Secondary | ICD-10-CM | POA: Diagnosis not present

## 2017-04-15 DIAGNOSIS — M5416 Radiculopathy, lumbar region: Secondary | ICD-10-CM | POA: Diagnosis not present

## 2017-04-15 DIAGNOSIS — Z79891 Long term (current) use of opiate analgesic: Secondary | ICD-10-CM | POA: Diagnosis not present

## 2017-04-15 DIAGNOSIS — F17200 Nicotine dependence, unspecified, uncomplicated: Secondary | ICD-10-CM | POA: Diagnosis not present

## 2017-04-15 DIAGNOSIS — M545 Low back pain: Secondary | ICD-10-CM | POA: Diagnosis not present

## 2017-04-15 DIAGNOSIS — Z716 Tobacco abuse counseling: Secondary | ICD-10-CM | POA: Diagnosis not present

## 2017-04-15 DIAGNOSIS — Z87891 Personal history of nicotine dependence: Secondary | ICD-10-CM | POA: Diagnosis not present

## 2017-05-02 ENCOUNTER — Encounter
Admission: RE | Disposition: A | Discharge status: Home / Self Care | Payer: Self-pay | Source: Ambulatory Visit | Attending: Ophthalmology

## 2017-05-02 ENCOUNTER — Ambulatory Visit: Payer: BLUE CROSS/BLUE SHIELD | Admitting: Anesthesiology

## 2017-05-02 ENCOUNTER — Ambulatory Visit
Admission: RE | Admit: 2017-05-02 | Discharge: 2017-05-02 | Disposition: A | Discharge status: Home / Self Care | Payer: BLUE CROSS/BLUE SHIELD | Source: Ambulatory Visit | Attending: Ophthalmology | Admitting: Ophthalmology

## 2017-05-02 ENCOUNTER — Encounter: Payer: Self-pay | Admitting: *Deleted

## 2017-05-02 DIAGNOSIS — E1136 Type 2 diabetes mellitus with diabetic cataract: Secondary | ICD-10-CM | POA: Insufficient documentation

## 2017-05-02 DIAGNOSIS — I10 Essential (primary) hypertension: Secondary | ICD-10-CM | POA: Insufficient documentation

## 2017-05-02 DIAGNOSIS — M1990 Unspecified osteoarthritis, unspecified site: Secondary | ICD-10-CM | POA: Diagnosis not present

## 2017-05-02 DIAGNOSIS — I739 Peripheral vascular disease, unspecified: Secondary | ICD-10-CM | POA: Insufficient documentation

## 2017-05-02 DIAGNOSIS — E785 Hyperlipidemia, unspecified: Secondary | ICD-10-CM | POA: Insufficient documentation

## 2017-05-02 DIAGNOSIS — F17200 Nicotine dependence, unspecified, uncomplicated: Secondary | ICD-10-CM | POA: Insufficient documentation

## 2017-05-02 DIAGNOSIS — H2589 Other age-related cataract: Secondary | ICD-10-CM | POA: Diagnosis not present

## 2017-05-02 DIAGNOSIS — H2511 Age-related nuclear cataract, right eye: Secondary | ICD-10-CM | POA: Diagnosis not present

## 2017-05-02 HISTORY — DX: Type 2 diabetes mellitus without complications: E11.9

## 2017-05-02 HISTORY — DX: Acute embolism and thrombosis of unspecified deep veins of unspecified lower extremity: I82.409

## 2017-05-02 HISTORY — PX: CATARACT EXTRACTION W/PHACO: SHX586

## 2017-05-02 LAB — GLUCOSE, CAPILLARY: GLUCOSE-CAPILLARY: 105 mg/dL — AB (ref 65–99)

## 2017-05-02 SURGERY — PHACOEMULSIFICATION, CATARACT, WITH IOL INSERTION
Anesthesia: Monitor Anesthesia Care | Site: Eye | Laterality: Right | Wound class: Clean

## 2017-05-02 MED ORDER — ARMC OPHTHALMIC DILATING DROPS
OPHTHALMIC | Status: AC
  Filled 2017-05-02: qty 0.4

## 2017-05-02 MED ORDER — MOXIFLOXACIN HCL 0.5 % OP SOLN
OPHTHALMIC | Status: DC | PRN
  Administered 2017-05-02: 1 [drp] via OPHTHALMIC

## 2017-05-02 MED ORDER — NA CHONDROIT SULF-NA HYALURON 40-17 MG/ML IO SOLN
INTRAOCULAR | Status: AC
  Filled 2017-05-02: qty 1

## 2017-05-02 MED ORDER — MIDAZOLAM HCL 2 MG/2ML IJ SOLN
INTRAMUSCULAR | Status: AC
  Filled 2017-05-02: qty 2

## 2017-05-02 MED ORDER — LIDOCAINE HCL (CARDIAC) 20 MG/ML IV SOLN
INTRAVENOUS | Status: DC | PRN
  Administered 2017-05-02: 40 mg via INTRAVENOUS

## 2017-05-02 MED ORDER — LIDOCAINE HCL (PF) 4 % IJ SOLN
INTRAOCULAR | Status: DC | PRN
  Administered 2017-05-02: 4 mL via OPHTHALMIC

## 2017-05-02 MED ORDER — ARMC OPHTHALMIC DILATING DROPS
1.0000 "application " | OPHTHALMIC | Status: AC
  Administered 2017-05-02 (×3): 1 via OPHTHALMIC

## 2017-05-02 MED ORDER — MOXIFLOXACIN HCL 0.5 % OP SOLN: 1.0000 [drp] | OPHTHALMIC | Status: DC | PRN

## 2017-05-02 MED ORDER — TRYPAN BLUE 0.06 % OP SOLN
OPHTHALMIC | Status: AC
  Filled 2017-05-02: qty 0.5

## 2017-05-02 MED ORDER — SODIUM CHLORIDE 0.9 % IV SOLN
INTRAVENOUS | Status: DC
  Administered 2017-05-02: 08:00:00 via INTRAVENOUS

## 2017-05-02 MED ORDER — CARBACHOL 0.01 % IO SOLN
INTRAOCULAR | Status: DC | PRN
  Administered 2017-05-02: 0.5 mL via INTRAOCULAR

## 2017-05-02 MED ORDER — METOPROLOL TARTRATE 5 MG/5ML IV SOLN
INTRAVENOUS | Status: DC | PRN
  Administered 2017-05-02: 2 mg via INTRAVENOUS
  Administered 2017-05-02: 1 mg via INTRAVENOUS

## 2017-05-02 MED ORDER — EPINEPHRINE PF 1 MG/ML IJ SOLN
INTRAMUSCULAR | Status: AC
Start: 2017-05-02 — End: ?
  Filled 2017-05-02: qty 1

## 2017-05-02 MED ORDER — LIDOCAINE HCL (PF) 4 % IJ SOLN
INTRAMUSCULAR | Status: AC
  Filled 2017-05-02: qty 5

## 2017-05-02 MED ORDER — MIDAZOLAM HCL 2 MG/2ML IJ SOLN
INTRAMUSCULAR | Status: DC | PRN
  Administered 2017-05-02 (×2): 1 mg via INTRAVENOUS

## 2017-05-02 MED ORDER — TRYPAN BLUE 0.06 % OP SOLN
OPHTHALMIC | Status: DC | PRN
  Administered 2017-05-02: 0.5 mL via INTRAOCULAR

## 2017-05-02 MED ORDER — LIDOCAINE HCL (PF) 2 % IJ SOLN
INTRAMUSCULAR | Status: AC
  Filled 2017-05-02: qty 2

## 2017-05-02 MED ORDER — EPINEPHRINE PF 1 MG/ML IJ SOLN
INTRAOCULAR | Status: DC | PRN
  Administered 2017-05-02: 200 mL via OPHTHALMIC

## 2017-05-02 MED ORDER — MOXIFLOXACIN HCL 0.5 % OP SOLN
OPHTHALMIC | Status: AC
  Filled 2017-05-02: qty 3

## 2017-05-02 MED ORDER — NA HYALUR & NA CHOND-NA HYALUR 0.55-0.5 ML IO KIT
PACK | INTRAOCULAR | Status: DC | PRN
  Administered 2017-05-02: 1 via OPHTHALMIC

## 2017-05-02 MED ORDER — METOPROLOL TARTRATE 5 MG/5ML IV SOLN
INTRAVENOUS | Status: AC
  Filled 2017-05-02: qty 5

## 2017-05-02 MED ORDER — POVIDONE-IODINE 5 % OP SOLN
OPHTHALMIC | Status: AC
Start: 2017-05-02 — End: ?
  Filled 2017-05-02: qty 30

## 2017-05-02 MED ORDER — POVIDONE-IODINE 5 % OP SOLN
OPHTHALMIC | Status: DC | PRN
  Administered 2017-05-02: 1 via OPHTHALMIC

## 2017-05-02 SURGICAL SUPPLY — 18 items
CANNULA ANT/CHMB 27GA (MISCELLANEOUS) ×4 IMPLANT
DISSECTOR HYDRO NUCLEUS 50X22 (MISCELLANEOUS) ×2 IMPLANT
GLOVE BIO SURGEON STRL SZ8 (GLOVE) ×2 IMPLANT
GLOVE BIOGEL M 6.5 STRL (GLOVE) ×2 IMPLANT
GLOVE SURG LX 7.5 STRW (GLOVE) ×1
GLOVE SURG LX STRL 7.5 STRW (GLOVE) ×1 IMPLANT
GOWN STRL REUS W/ TWL LRG LVL3 (GOWN DISPOSABLE) ×2 IMPLANT
GOWN STRL REUS W/TWL LRG LVL3 (GOWN DISPOSABLE) ×2
LABEL CATARACT MEDS ST (LABEL) ×2 IMPLANT
LENS IOL TECNIS ITEC 21.5 (Intraocular Lens) ×2 IMPLANT
PACK CATARACT (MISCELLANEOUS) ×2 IMPLANT
PACK CATARACT KING (MISCELLANEOUS) ×2 IMPLANT
PACK EYE AFTER SURG (MISCELLANEOUS) ×2 IMPLANT
SOL BSS BAG (MISCELLANEOUS) ×2
SOLUTION BSS BAG (MISCELLANEOUS) ×1 IMPLANT
SYR 3ML LL SCALE MARK (SYRINGE) ×2 IMPLANT
WATER STERILE IRR 250ML POUR (IV SOLUTION) ×2 IMPLANT
WIPE NON LINTING 3.25X3.25 (MISCELLANEOUS) ×2 IMPLANT

## 2017-05-02 NOTE — Discharge Instructions (Signed)
Eye Surgery Discharge Instructions  Expect mild scratchy sensation or mild soreness. DO NOT RUB YOUR EYE!  The day of surgery:  Minimal physical activity, but bed rest is not required  No reading, computer work, or close hand work  No bending, lifting, or straining.  May watch TV  For 24 hours:  No driving, legal decisions, or alcoholic beverages  Safety precautions  Eat anything you prefer: It is better to start with liquids, then soup then solid foods.  _____ Eye patch should be worn until postoperative exam tomorrow.  _x___ Solar shield eyeglasses should be worn for comfort in the sunlight/patch while sleeping  Wear patch over operative eye at bedtime for the next week.  Resume all regular medications including aspirin or Coumadin if these were discontinued prior to surgery.  You may shower, bathe, shave, or wash your hair.  Tylenol may be taken for mild discomfort.  Call your doctor if you experience significant pain, nausea, or vomiting, fever > 101 or other signs of infection. [phone] or [phone] Specific instructions:  Follow-up Information    [place] CraneKing, Bradley Mark, MD Follow up on 05/03/2017.   Specialty:  Ophthalmology Why:  Your follow up appointment will be in the [place] office at 9:40 am.  Directions are included in your discharge packet. Contact information: [address] [place] [address] [phone]

## 2017-05-02 NOTE — H&P (Signed)
The History and Physical notes are on paper, have been signed, and are to be scanned.   I have examined the patient and there are no changes to the H&P.   Willey BladeBradley King 05/02/2017 8:40 AM

## 2017-05-02 NOTE — Anesthesia Postprocedure Evaluation (Signed)
Anesthesia Post Note  Patient: Michael Giles  Procedure(s) Performed: CATARACT EXTRACTION PHACO AND INTRAOCULAR LENS PLACEMENT (IOC) (Right Eye)  Patient location during evaluation: PACU Anesthesia Type: MAC Level of consciousness: awake and alert Pain management: pain level controlled Vital Signs Assessment: post-procedure vital signs reviewed and stable Respiratory status: spontaneous breathing, nonlabored ventilation, respiratory function stable and patient connected to nasal cannula oxygen Cardiovascular status: stable and blood pressure returned to baseline Postop Assessment: no apparent nausea or vomiting Anesthetic complications: no     Last Vitals:  Vitals:   05/02/17 0742 05/02/17 0917  BP: (!) 161/96 (!) 163/91  Pulse: 73 61  Resp: 16 15  Temp: 36.9 C 36.4 C  SpO2: 99% 98%    Last Pain:  Vitals:   05/02/17 0917  TempSrc: Temporal  PainSc: 4                  Martha Clan

## 2017-05-02 NOTE — Anesthesia Post-op Follow-up Note (Signed)
Anesthesia QCDR form completed.        

## 2017-05-02 NOTE — Transfer of Care (Signed)
Immediate Anesthesia Transfer of Care Note  Patient: Michael Giles  Procedure(s) Performed: CATARACT EXTRACTION PHACO AND INTRAOCULAR LENS PLACEMENT (IOC) (Right Eye)  Patient Location: PACU  Anesthesia Type:MAC  Level of Consciousness: awake, alert  and oriented  Airway & Oxygen Therapy: RA  Post-op Assessment: Report given to RN and Post -op Vital signs reviewed and stable  Post vital signs: Reviewed and stable  Last Vitals:  Vitals:   05/02/17 0742  BP: (!) 161/96  Pulse: 73  Resp: 16  Temp: 36.9 C  SpO2: 99%    Last Pain:  Vitals:   05/02/17 0742  TempSrc: Oral  PainSc: 7          Complications: No apparent anesthesia complications

## 2017-05-02 NOTE — Anesthesia Preprocedure Evaluation (Signed)
Anesthesia Evaluation  Patient identified by MRN, date of birth, ID band Patient awake    Reviewed: Allergy & Precautions, H&P , NPO status , Patient's Chart, lab work & pertinent test results, reviewed documented beta blocker date and time   History of Anesthesia Complications Negative for: history of anesthetic complications  Airway Mallampati: II  TM Distance: >3 FB Neck ROM: full    Dental  (+) Poor Dentition, Chipped, Missing, Dental Advidsory Given   Pulmonary neg shortness of breath, neg COPD, neg recent URI, Current Smoker,           Cardiovascular Exercise Tolerance: Good hypertension, (-) angina+ Peripheral Vascular Disease  (-) CAD, (-) Past MI, (-) Cardiac Stents and (-) CABG (-) dysrhythmias (-) Valvular Problems/Murmurs     Neuro/Psych PSYCHIATRIC DISORDERS negative neurological ROS     GI/Hepatic negative GI ROS, Neg liver ROS,   Endo/Other  diabetes (borderline)  Renal/GU negative Renal ROS  negative genitourinary   Musculoskeletal   Abdominal   Peds  Hematology negative hematology ROS (+)   Anesthesia Other Findings Past Medical History: No date: Arthritis No date: Degeneration of lumbar or lumbosacral intervertebral disc No date: Diabetes mellitus without complication (HCC) No date: DVT (deep venous thrombosis) (HCC)     Comment:  H/O UPPER LEFT LEG No date: History of angioplasty No date: Hyperlipidemia No date: Hypertension No date: Impotence of organic origin No date: Opioid type dependence, continuous ([place]) No date: Other symptoms involving cardiovascular system No date: Panic disorder without agoraphobia No date: Postlaminectomy syndrome, lumbar region No date: Spasm of muscle No date: Unspecified vitamin D deficiency   Reproductive/Obstetrics negative OB ROS                             Anesthesia Physical Anesthesia Plan  ASA: II  Anesthesia Plan: MAC    Post-op Pain Management:    Induction: Intravenous  PONV Risk Score and Plan: 0 and Treatment may vary due to age  Airway Management Planned: Nasal Cannula  Additional Equipment:   Intra-op Plan:   Post-operative Plan:   Informed Consent: I have reviewed the patients History and Physical, chart, labs and discussed the procedure including the risks, benefits and alternatives for the proposed anesthesia with the patient or authorized representative who has indicated his/her understanding and acceptance.   Dental Advisory Given  Plan Discussed with: Anesthesiologist, CRNA and Surgeon  Anesthesia Plan Comments:         Anesthesia Quick Evaluation

## 2017-05-02 NOTE — Op Note (Signed)
OPERATIVE NOTE  Michael Giles [id number] 05/02/2017   PREOPERATIVE DIAGNOSIS:  Nuclear sclerotic cataract right eye.  H25.11, Mature white cataract.   POSTOPERATIVE DIAGNOSIS:    Nuclear sclerotic cataract right eye.     PROCEDURE:  Complex Phacoemusification with posterior chamber intraocular lens placement of the right eye, requiring trypan blue for visualization of the anterior capsule.  CPT [phone]   LENS:   Implant Name Type Inv. Item Serial No. Manufacturer Lot No. LRB No. Used  LENS IOL DIOP 21.5 - [id number] Intraocular Lens LENS IOL DIOP 21.5 [id number] AMO   Right 1       PCB00 +21.5   ULTRASOUND TIME: 1 minutes 21 seconds.  CDE 16.19   SURGEON:  Willey BladeBradley King, MD, MPH  ANESTHESIOLOGIST: Anesthesiologist: Lenard SimmerKarenz, Andrew, MD CRNA: Geralynn Ochsook-Martin, Cindy; Starr, Deana, CRNA   ANESTHESIA:  Topical with tetracaine drops augmented with 1% preservative-free intracameral lidocaine.  ESTIMATED BLOOD LOSS: less than 1 mL.   COMPLICATIONS:  None.   DESCRIPTION OF PROCEDURE:  The patient was identified in the holding room and transported to the operating room and placed in the supine position under the operating microscope.  The right eye was identified as the operative eye and it was prepped and draped in the usual sterile ophthalmic fashion.   A 1.0 millimeter clear-corneal paracentesis was made at the 10:30 position. 0.5 ml of preservative-free 1% lidocaine with epinephrine was injected into the anterior chamber.  There was a poor red reflex, so Trypan blue was instilled under an air bubble and rinsed out with BSS to stain the capsule for improved visualization.   The anterior chamber was filled with viscoat viscoelastic followed by a small amount of provisc (soft shell technique).  A 2.4 millimeter keratome was used to make a near-clear corneal incision at the 8:00 position.  A curvilinear capsulorrhexis was made with a cystotome and capsulorrhexis forceps.  Balanced salt  solution was used for gentle hydrodissection.   Phacoemulsification was then used in stop and chop fashion to remove the lens nucleus and epinucleus.  The remaining cortex was then removed using the irrigation and aspiration handpiece. Provisc was then placed into the capsular bag to distend it for lens placement.  A lens was then injected into the capsular bag.  The remaining viscoelastic was aspirated.   Wounds were hydrated with balanced salt solution.  The anterior chamber was inflated to a physiologic pressure with balanced salt solution.   Intracameral vigamox 0.1 mL undiluted was injected into the eye and a drop placed onto the ocular surface.  No wound leaks were noted.  The patient was taken to the recovery room in stable condition without complications of anesthesia or surgery  Willey BladeBradley King 05/02/2017, 9:14 AM

## 2017-05-03 ENCOUNTER — Encounter: Payer: Self-pay | Admitting: Ophthalmology

## 2017-07-16 DIAGNOSIS — Z720 Tobacco use: Secondary | ICD-10-CM | POA: Diagnosis not present

## 2017-07-16 DIAGNOSIS — Z5181 Encounter for therapeutic drug level monitoring: Secondary | ICD-10-CM | POA: Diagnosis not present

## 2017-07-16 DIAGNOSIS — G894 Chronic pain syndrome: Secondary | ICD-10-CM | POA: Diagnosis not present

## 2017-07-16 DIAGNOSIS — M545 Low back pain: Secondary | ICD-10-CM | POA: Diagnosis not present

## 2017-07-16 DIAGNOSIS — Z716 Tobacco abuse counseling: Secondary | ICD-10-CM | POA: Diagnosis not present

## 2017-07-16 DIAGNOSIS — F17200 Nicotine dependence, unspecified, uncomplicated: Secondary | ICD-10-CM | POA: Diagnosis not present

## 2017-07-16 DIAGNOSIS — Z79891 Long term (current) use of opiate analgesic: Secondary | ICD-10-CM | POA: Diagnosis not present

## 2017-07-16 DIAGNOSIS — M5416 Radiculopathy, lumbar region: Secondary | ICD-10-CM | POA: Diagnosis not present

## 2017-07-16 DIAGNOSIS — Z87891 Personal history of nicotine dependence: Secondary | ICD-10-CM | POA: Diagnosis not present

## 2017-10-09 DIAGNOSIS — Z79891 Long term (current) use of opiate analgesic: Secondary | ICD-10-CM | POA: Diagnosis not present

## 2017-10-09 DIAGNOSIS — G894 Chronic pain syndrome: Secondary | ICD-10-CM | POA: Diagnosis not present

## 2017-10-09 DIAGNOSIS — M5416 Radiculopathy, lumbar region: Secondary | ICD-10-CM | POA: Diagnosis not present

## 2017-10-09 DIAGNOSIS — M545 Low back pain: Secondary | ICD-10-CM | POA: Diagnosis not present

## 2018-01-03 DIAGNOSIS — M545 Low back pain: Secondary | ICD-10-CM | POA: Diagnosis not present

## 2018-01-03 DIAGNOSIS — Z5181 Encounter for therapeutic drug level monitoring: Secondary | ICD-10-CM | POA: Diagnosis not present

## 2018-01-03 DIAGNOSIS — M5416 Radiculopathy, lumbar region: Secondary | ICD-10-CM | POA: Diagnosis not present

## 2018-01-03 DIAGNOSIS — Z79891 Long term (current) use of opiate analgesic: Secondary | ICD-10-CM | POA: Diagnosis not present

## 2018-01-03 DIAGNOSIS — G894 Chronic pain syndrome: Secondary | ICD-10-CM | POA: Diagnosis not present

## 2018-03-31 DIAGNOSIS — G894 Chronic pain syndrome: Secondary | ICD-10-CM | POA: Diagnosis not present

## 2018-03-31 DIAGNOSIS — M5416 Radiculopathy, lumbar region: Secondary | ICD-10-CM | POA: Diagnosis not present

## 2018-03-31 DIAGNOSIS — M545 Low back pain: Secondary | ICD-10-CM | POA: Diagnosis not present

## 2018-03-31 DIAGNOSIS — Z79891 Long term (current) use of opiate analgesic: Secondary | ICD-10-CM | POA: Diagnosis not present

## 2018-06-25 DIAGNOSIS — M545 Low back pain: Secondary | ICD-10-CM | POA: Diagnosis not present

## 2018-06-25 DIAGNOSIS — Z79891 Long term (current) use of opiate analgesic: Secondary | ICD-10-CM | POA: Diagnosis not present

## 2018-06-25 DIAGNOSIS — M5416 Radiculopathy, lumbar region: Secondary | ICD-10-CM | POA: Diagnosis not present

## 2018-06-25 DIAGNOSIS — Z5181 Encounter for therapeutic drug level monitoring: Secondary | ICD-10-CM | POA: Diagnosis not present

## 2018-06-25 DIAGNOSIS — G894 Chronic pain syndrome: Secondary | ICD-10-CM | POA: Diagnosis not present

## 2018-08-22 DIAGNOSIS — M5416 Radiculopathy, lumbar region: Secondary | ICD-10-CM | POA: Diagnosis not present

## 2018-08-22 DIAGNOSIS — M545 Low back pain: Secondary | ICD-10-CM | POA: Diagnosis not present

## 2018-08-22 DIAGNOSIS — G894 Chronic pain syndrome: Secondary | ICD-10-CM | POA: Diagnosis not present

## 2018-08-22 DIAGNOSIS — Z79891 Long term (current) use of opiate analgesic: Secondary | ICD-10-CM | POA: Diagnosis not present

## 2018-10-17 DIAGNOSIS — G894 Chronic pain syndrome: Secondary | ICD-10-CM | POA: Diagnosis not present

## 2018-10-17 DIAGNOSIS — Z79891 Long term (current) use of opiate analgesic: Secondary | ICD-10-CM | POA: Diagnosis not present

## 2018-10-17 DIAGNOSIS — M545 Low back pain: Secondary | ICD-10-CM | POA: Diagnosis not present

## 2018-10-17 DIAGNOSIS — M5416 Radiculopathy, lumbar region: Secondary | ICD-10-CM | POA: Diagnosis not present

## 2019-01-08 DIAGNOSIS — M545 Low back pain: Secondary | ICD-10-CM | POA: Diagnosis not present

## 2019-01-08 DIAGNOSIS — M5416 Radiculopathy, lumbar region: Secondary | ICD-10-CM | POA: Diagnosis not present

## 2019-01-08 DIAGNOSIS — Z79891 Long term (current) use of opiate analgesic: Secondary | ICD-10-CM | POA: Diagnosis not present

## 2019-01-08 DIAGNOSIS — G894 Chronic pain syndrome: Secondary | ICD-10-CM | POA: Diagnosis not present

## 2019-04-08 ENCOUNTER — Other Ambulatory Visit: Payer: Self-pay | Admitting: Urology

## 2019-04-08 ENCOUNTER — Other Ambulatory Visit: Payer: Self-pay

## 2019-04-08 ENCOUNTER — Encounter: Payer: Self-pay | Admitting: Emergency Medicine

## 2019-04-08 ENCOUNTER — Emergency Department: Payer: BC Managed Care – PPO

## 2019-04-08 ENCOUNTER — Emergency Department
Admission: EM | Admit: 2019-04-08 | Discharge: 2019-04-08 | Disposition: A | Discharge status: Home / Self Care | Payer: BC Managed Care – PPO | Attending: Emergency Medicine | Admitting: Emergency Medicine

## 2019-04-08 DIAGNOSIS — I10 Essential (primary) hypertension: Secondary | ICD-10-CM | POA: Insufficient documentation

## 2019-04-08 DIAGNOSIS — E119 Type 2 diabetes mellitus without complications: Secondary | ICD-10-CM | POA: Insufficient documentation

## 2019-04-08 DIAGNOSIS — N132 Hydronephrosis with renal and ureteral calculous obstruction: Secondary | ICD-10-CM | POA: Diagnosis not present

## 2019-04-08 DIAGNOSIS — Z7982 Long term (current) use of aspirin: Secondary | ICD-10-CM | POA: Diagnosis not present

## 2019-04-08 DIAGNOSIS — N200 Calculus of kidney: Secondary | ICD-10-CM | POA: Insufficient documentation

## 2019-04-08 DIAGNOSIS — R1030 Lower abdominal pain, unspecified: Secondary | ICD-10-CM | POA: Diagnosis not present

## 2019-04-08 DIAGNOSIS — R1031 Right lower quadrant pain: Secondary | ICD-10-CM | POA: Insufficient documentation

## 2019-04-08 DIAGNOSIS — Z79899 Other long term (current) drug therapy: Secondary | ICD-10-CM | POA: Diagnosis not present

## 2019-04-08 DIAGNOSIS — R109 Unspecified abdominal pain: Secondary | ICD-10-CM | POA: Diagnosis not present

## 2019-04-08 DIAGNOSIS — F17210 Nicotine dependence, cigarettes, uncomplicated: Secondary | ICD-10-CM | POA: Insufficient documentation

## 2019-04-08 LAB — URINALYSIS, COMPLETE (UACMP) WITH MICROSCOPIC
Bacteria, UA: NONE SEEN
Bilirubin Urine: NEGATIVE
Glucose, UA: NEGATIVE mg/dL
Ketones, ur: NEGATIVE mg/dL
Nitrite: NEGATIVE
Protein, ur: NEGATIVE mg/dL
Specific Gravity, Urine: 1.02 (ref 1.005–1.030)
pH: 5 (ref 5.0–8.0)

## 2019-04-08 LAB — COMPREHENSIVE METABOLIC PANEL
ALT: 13 U/L (ref 0–44)
AST: 17 U/L (ref 15–41)
Albumin: 4.3 g/dL (ref 3.5–5.0)
Alkaline Phosphatase: 106 U/L (ref 38–126)
Anion gap: 9 (ref 5–15)
BUN: 20 mg/dL (ref 6–20)
CO2: 25 mmol/L (ref 22–32)
Calcium: 9.2 mg/dL (ref 8.9–10.3)
Chloride: 103 mmol/L (ref 98–111)
Creatinine, Ser: 1.14 mg/dL (ref 0.61–1.24)
GFR calc Af Amer: >60 mL/min (ref >60)
GFR calc non Af Amer: >60 mL/min (ref >60)
Glucose, Bld: 131 mg/dL — ABNORMAL HIGH (ref 70–99)
Potassium: 3.7 mmol/L (ref 3.5–5.1)
Sodium: 137 mmol/L (ref 135–145)
Total Bilirubin: 0.5 mg/dL (ref 0.3–1.2)
Total Protein: 7.8 g/dL (ref 6.5–8.1)

## 2019-04-08 LAB — CBC
HCT: 46 % (ref 39.0–52.0)
Hemoglobin: 14.9 g/dL (ref 13.0–17.0)
MCH: 27.6 pg (ref 26.0–34.0)
MCHC: 32.4 g/dL (ref 30.0–36.0)
MCV: 85.3 fL (ref 80.0–100.0)
Platelets: 232 10*3/uL (ref 150–400)
RBC: 5.39 MIL/uL (ref 4.22–5.81)
RDW: 13.9 % (ref 11.5–15.5)
WBC: 13.3 10*3/uL — ABNORMAL HIGH (ref 4.0–10.5)
nRBC: 0 % (ref 0.0–0.2)

## 2019-04-08 MED ORDER — ACETAMINOPHEN 500 MG PO TABS
1000.0000 mg | ORAL_TABLET | Freq: Once | ORAL | Status: AC
  Administered 2019-04-08: 1000 mg via ORAL
  Filled 2019-04-08: qty 2

## 2019-04-08 MED ORDER — IBUPROFEN 800 MG PO TABS: 800.0000 mg | ORAL_TABLET | Freq: Three times a day (TID) | ORAL | 0 refills | Status: AC | PRN

## 2019-04-08 MED ORDER — TAMSULOSIN HCL 0.4 MG PO CAPS: 0.4000 mg | ORAL_CAPSULE | Freq: Every day | ORAL | 0 refills | Status: AC

## 2019-04-08 MED ORDER — TAMSULOSIN HCL 0.4 MG PO CAPS
0.4000 mg | ORAL_CAPSULE | Freq: Once | ORAL | Status: AC
  Administered 2019-04-08: 0.4 mg via ORAL
  Filled 2019-04-08: qty 1

## 2019-04-08 MED ORDER — ONDANSETRON 4 MG PO TBDP: 4.0000 mg | ORAL_TABLET | Freq: Three times a day (TID) | ORAL | 0 refills | Status: DC | PRN

## 2019-04-08 MED ORDER — ONDANSETRON HCL 4 MG/2ML IJ SOLN
4.0000 mg | Freq: Once | INTRAMUSCULAR | Status: AC
  Administered 2019-04-08: 01:00:00 4 mg via INTRAVENOUS
  Filled 2019-04-08: qty 2

## 2019-04-08 MED ORDER — OXYCODONE HCL 5 MG PO TABS
5.0000 mg | ORAL_TABLET | Freq: Once | ORAL | Status: AC
  Administered 2019-04-08: 02:00:00 5 mg via ORAL
  Filled 2019-04-08: qty 1

## 2019-04-08 MED ORDER — MORPHINE SULFATE (PF) 4 MG/ML IV SOLN
4.0000 mg | Freq: Once | INTRAVENOUS | Status: AC
  Administered 2019-04-08: 4 mg via INTRAVENOUS
  Filled 2019-04-08: qty 1

## 2019-04-08 MED ORDER — OXYCODONE-ACETAMINOPHEN 5-325 MG PO TABS: 1.0000 | ORAL_TABLET | ORAL | 0 refills | Status: DC | PRN

## 2019-04-08 MED ORDER — KETOROLAC TROMETHAMINE 30 MG/ML IJ SOLN
15.0000 mg | Freq: Once | INTRAMUSCULAR | Status: AC
  Administered 2019-04-08: 15 mg via INTRAVENOUS
  Filled 2019-04-08: qty 1

## 2019-04-08 NOTE — Discharge Instructions (Addendum)
You have been seen in the Emergency Department (ED)  Today and was diagnosed with kidney stones. While the stone is traveling through the ureter, which is the tube that carries urine from the kidney to the bladder, you will probably feel pain. The pain may be mild or very severe. You may also have some blood in your urine. As soon as the stone reaches the bladder, any intense pain should go away. If a stone is too large to pass on its own, you may need a medical procedure to help you pass the stone.   As we have discussed, please drink plenty of fluids and use a urinary strainer to attempt to capture the stone.  Please call Urology this morning for an appointment as it most likely that you will need a procedure to pass such large stone.  Take ibuprofen 600mg  every 6 hours for the pain. If the pain is not well controlled with ibuprofen you may take one percocet every 4 hours. Do not take tylenol while taking percocet. Please also take your prescribed flomax daily. Check with your doctor if you have a history of gastritis, stomach ulcers, renal failure or impaired kidney function as you may not be able to take ibuprofen/ motrin. Your doctor can give you a different prescription for pain control.  Follow-up with your doctor or return to the ER in 12-24 hours if your pain is not well controlled, if you develop pain or burning with urination, or if you develop a fever. Otherwise follow up in 3-5 days with your doctor.  As I explained to you, make sure to call GI for an appointment within the next few weeks for a colonoscopy to ensure that what was seen on your CT scan is just scarring from a prior infection and not a mass like cancer.  When should you call for help?  Call your doctor now or seek immediate medical care if:  You cannot keep down fluids.  Your pain gets worse.  You have a fever or chills.  You have new or worse pain in your back just below your rib cage (the flank area).  You have new or  more blood in your urine. You have pain or burning with urination You are unable to urinate You have abdominal pain  Watch closely for changes in your health, and be sure to contact your doctor if:  You do not get better as expected  How can you care for yourself at home?  Drink plenty of fluids, enough so that your urine is light yellow or clear like water. If you have kidney, heart, or liver disease and have to limit fluids, talk with your doctor before you increase the amount of fluids you drink.  Take pain medicines exactly as directed. Call your doctor if you think you are having a problem with your medicine.  If the doctor gave you a prescription medicine for pain, take it as prescribed.  If you are not taking a prescription pain medicine, ask your doctor if you can take an over-the-counter medicine. Read and follow all instructions on the label. Your doctor may ask you to strain your urine so that you can collect your kidney stone when it passes. You can use a kitchen strainer or a tea strainer to catch the stone. Store it in a plastic bag until you see your doctor again.  Preventing future kidney stones  Some changes in your diet may help prevent kidney stones. Depending on the cause of  your stones, your doctor may recommend that you:  Drink plenty of fluids, enough so that your urine is light yellow or clear like water. If you have kidney, heart, or liver disease and have to limit fluids, talk with your doctor before you increase the amount of fluids you drink.  Limit coffee, tea, and alcohol. Also avoid grapefruit juice.  Do not take more than the recommended daily dose of vitamins C and D.  Avoid antacids such as Gaviscon, Maalox, Mylanta, or Tums.  Limit the amount of salt (sodium) in your diet.  Eat a balanced diet that is not too high in protein.  Limit foods that are high in a substance called oxalate, which can cause kidney stones. These foods include dark green vegetables,  rhubarb, chocolate, wheat bran, nuts, cranberries, and beans.

## 2019-04-08 NOTE — ED Notes (Signed)
Pt resting on stretcher in exam room with no distress noted; reports since yesterday having rt flank pain radiating around in rt side lower abd with no accomp symptoms; denies hx of same; st "feels like I have to poop"; BM PTA and denies urinary c/o

## 2019-04-08 NOTE — ED Triage Notes (Signed)
Patient to the ER for c/o right lower back/flank pain with pain radiating to RLQ abd. Patient reports pain started today, worsened in last 6 hours.

## 2019-04-08 NOTE — ED Provider Notes (Signed)
[facility] Emergency Department Provider Note  ____________________________________________  Time seen: Approximately 3:00 AM  I have reviewed the triage vital signs and the nursing notes.   HISTORY  Chief Complaint Flank Pain   HPI Michael Giles is a 58 y.o. male with history of hypertension, hyperlipidemia, diabetes, DVT who presents for evaluation of right flank pain.  Pain started 8 hours ago.  Pain is sharp, located in the right flank, radiating to the right lower abdomen.  Pain is 8 out of 10.  No nausea or vomiting, no fever or chills, no dysuria or hematuria.  No prior history of kidney stones.   Past Medical History:  Diagnosis Date  . Arthritis   . Degeneration of lumbar or lumbosacral intervertebral disc   . Diabetes mellitus without complication (HCC)   . DVT (deep venous thrombosis) (HCC)    H/O UPPER LEFT LEG  . History of angioplasty   . Hyperlipidemia   . Hypertension   . Impotence of organic origin   . Opioid type dependence, continuous (HCC)   . Other symptoms involving cardiovascular system   . Panic disorder without agoraphobia   . Postlaminectomy syndrome, lumbar region   . Spasm of muscle   . Unspecified vitamin D deficiency     Patient Active Problem List   Diagnosis Date Noted  . Diabetes mellitus type 2, controlled, without complications (HCC) 07/30/2016  . Family history of diabetes mellitus in father 04/20/2016  . Need for immunization against influenza 03/31/2015  . Burning sensation of the foot 03/31/2015  . Chronic radicular lumbar pain 12/08/2014  . Compulsive tobacco user syndrome 11/22/2014  . Postlaminectomy syndrome, lumbar region 11/22/2014  . Panic attack 11/22/2014  . Polypharmacy 11/22/2014  . BP (high blood pressure) 11/22/2014  . HLD (hyperlipidemia) 11/22/2014  . Failure of erection 11/22/2014  . DDD (degenerative disc disease), lumbar 11/22/2014  . Dyssomnia 11/22/2014  . Clinical depression  11/22/2014  . Continuous opioid dependence (HCC) 11/22/2014  . HTN (hypertension) 04/21/2012  . Peripheral vascular disease (HCC) 04/21/2012  . Hyperlipidemia 04/21/2012    Past Surgical History:  Procedure Laterality Date  . ARTHRODESIS  2003  . BACK SURGERY    . CARDIAC CATHETERIZATION  2010   [facility]/65% blockage left leg no stent  . CATARACT EXTRACTION W/PHACO Right 05/02/2017   Procedure: CATARACT EXTRACTION PHACO AND INTRAOCULAR LENS PLACEMENT (IOC);  Surgeon: Nevada Crane, MD;  Location: [facility] ORS;  Service: Ophthalmology;  Laterality: Right;  [place] 01:20.0 AP%20.0 CDE16.19 Fluid pack lot # [id number]  . INGUINAL HERNIA REPAIR  2003  . LAMINECTOMY      Prior to Admission medications   Medication Sig Start Date End Date Taking? Authorizing Provider  aspirin 81 MG tablet Take 81 mg by mouth daily.    [provider]  cilostazol (PLETAL) 100 MG tablet Take 1 tablet (100 mg total) by mouth 2 (two) times daily. Patient taking differently: Take 100 mg by mouth daily.  04/20/16   Ellyn Hack, MD  ibuprofen (ADVIL) 800 MG tablet Take 1 tablet (800 mg total) by mouth every 8 (eight) hours as needed. 04/08/19   Nita Sickle, MD  lisinopril (PRINIVIL,ZESTRIL) 20 MG tablet Take 1 tablet (20 mg total) by mouth daily. Patient taking differently: Take 10 mg by mouth daily.  04/20/16   Ellyn Hack, MD  ondansetron (ZOFRAN ODT) 4 MG disintegrating tablet Take 1 tablet (4 mg total) by mouth every 8 (eight) hours as needed.  04/08/19   Nita Sickle, MD  OxyCODONE HCl ER 30 MG T12A Take 30 mg by mouth every 12 (twelve) hours. 04/29/15   Ellyn Hack, MD  oxyCODONE-acetaminophen (PERCOCET) 5-325 MG tablet Take 1 tablet by mouth every 4 (four) hours as needed. 04/08/19   Nita Sickle, MD  tadalafil (CIALIS) 10 MG tablet Take 10 mg by mouth daily as needed for erectile dysfunction.  06/12/13   Ellyn Hack, MD  tamsulosin (FLOMAX) 0.4 MG CAPS capsule Take 1  capsule (0.4 mg total) by mouth daily for 14 days. 04/08/19 04/22/19  Nita Sickle, MD    Allergies Crestor [rosuvastatin]  Family History  Problem Relation Age of Onset  . Heart disease Father   . Heart attack Father     Social History Social History   Tobacco Use  . Smoking status: Current Every Day Smoker    Packs/day: 1.50    Years: 32.00    Pack years: 48.00    Types: Cigarettes  . Smokeless tobacco: Never Used  Substance Use Topics  . Alcohol use: No  . Drug use: No    Review of Systems  Constitutional: Negative for fever. Eyes: Negative for visual changes. ENT: Negative for sore throat. Neck: No neck pain  Cardiovascular: Negative for chest pain. Respiratory: Negative for shortness of breath. Gastrointestinal: Negative for abdominal pain, vomiting or diarrhea. Genitourinary: Negative for dysuria. + R flank pain Musculoskeletal: Negative for back pain. Skin: Negative for rash. Neurological: Negative for headaches, weakness or numbness. Psych: No SI or HI  ____________________________________________   PHYSICAL EXAM:  VITAL SIGNS: ED Triage Vitals  Enc Vitals Group     BP 04/08/19 0031 (!) 203/118     Pulse Rate 04/08/19 0031 95     Resp 04/08/19 0031 18     Temp 04/08/19 0031 98.1 F (36.7 C)     Temp Source 04/08/19 0031 Oral     SpO2 04/08/19 0031 98 %     Weight 04/08/19 0033 220 lb (99.8 kg)     Height 04/08/19 0033  (1.778 m)     Head Circumference --      Peak Flow --      Pain Score 04/08/19 0039 8     Pain Loc --      Pain Edu? --      Excl. in GC? --     Constitutional: Alert and oriented. Well appearing and in no apparent distress. HEENT:      Head: Normocephalic and atraumatic.         Eyes: Conjunctivae are normal. Sclera is non-icteric.       Mouth/Throat: Mucous membranes are moist.       Neck: Supple with no signs of meningismus. Cardiovascular: Regular rate and rhythm. No murmurs, gallops, or rubs. 2+  symmetrical distal pulses are present in all extremities. No JVD. Respiratory: Normal respiratory effort. Lungs are clear to auscultation bilaterally. No wheezes, crackles, or rhonchi.  Gastrointestinal: Soft, non tender, and non distended with positive bowel sounds. No rebound or guarding. Genitourinary: No CVA tenderness. Musculoskeletal: Nontender with normal range of motion in all extremities. No edema, cyanosis, or erythema of extremities. Neurologic: Normal speech and language. Face is symmetric. Moving all extremities. No gross focal neurologic deficits are appreciated. Skin: Skin is warm, dry and intact. No rash noted. Psychiatric: Mood and affect are normal. Speech and behavior are normal.  ____________________________________________   LABS (all labs ordered are listed, but only abnormal results are  displayed)  Labs Reviewed  COMPREHENSIVE METABOLIC PANEL - Abnormal; Notable for the following components:      Result Value   Glucose, Bld 131 (*)    All other components within normal limits  CBC - Abnormal; Notable for the following components:   WBC 13.3 (*)    All other components within normal limits  URINALYSIS, COMPLETE (UACMP) WITH MICROSCOPIC - Abnormal; Notable for the following components:   Color, Urine YELLOW (*)    APPearance HAZY (*)    Hgb urine dipstick SMALL (*)    Leukocytes,Ua TRACE (*)    All other components within normal limits   ____________________________________________  EKG  none  ____________________________________________  RADIOLOGY  I have personally reviewed the images performed during this visit and I agree with the Radiologist's read.   Interpretation by Radiologist:  Ct Renal Stone Study  Result Date: 04/08/2019 CLINICAL DATA:  Flank pain, stone disease suspected EXAM: CT ABDOMEN AND PELVIS WITHOUT CONTRAST TECHNIQUE: Multidetector CT imaging of the abdomen and pelvis was performed following the standard protocol without IV  contrast. COMPARISON:  Lumbar radiographs 03/27/2014 FINDINGS: Lower chest: Bandlike area of atelectasis in the left lung base. Abundant mediastinal fat is noted. Normal heart size. No pericardial effusion. Hepatobiliary: No focal liver abnormality is seen. Phrygian cap morphology of the gallbladder, benign incidental finding. No gallstones, gallbladder wall thickening, or biliary dilatation. Pancreas: Fatty replacement of the pancreas. No pancreatic ductal dilatation or surrounding inflammatory changes. Spleen: Normal in size without focal abnormality. Adrenals/Urinary Tract: Normal adrenal glands. There is moderate right hydronephrosis with a 9 by 19 mm ovoid calculus positioned at the right ureteropelvic junction. There is asymmetrically increased right perinephric stranding and additional nonobstructing calculi in the interpolar and lower pole right kidney several of which larger measuring up to 11 mm in size. No visible or contour deforming renal lesions. No left urolithiasis. Mild left perinephric stranding is nonspecific. Mild circumferential bladder wall thickening, may be related to underdistention. Stomach/Bowel: Distal esophagus, stomach and duodenal sweep are unremarkable. No bowel wall thickening or dilatation. No evidence of obstruction. A normal appendix is visualized. Proximal colon is unremarkable. There is a segment of circumferentially thickened distal sigmoid colon in a region of multiple colonic diverticula without acute pericolonic inflammation. No extraluminal gas or abscess formation. Vascular/Lymphatic: Atherosclerotic plaque within the normal caliber aorta. Luminal evaluation limited in the absence of contrast. No suspicious or enlarged lymph nodes in the included lymphatic chains. Reproductive: Prostate at the upper limits of normal for size. Other: No abdominopelvic free fluid or free gas. No bowel containing hernias. Fat containing umbilical hernia small bilateral fat containing inguinal  hernias. Musculoskeletal: S-shaped scoliotic curvature of the thoracolumbar spine with multilevel discogenic and facet degenerative changes. Features are maximal at L4-5. No acute or suspicious osseous lesions. IMPRESSION: 1. Moderate right hydronephrosis and asymmetric right perinephric stranding with a 9 x 19 mm ovoid calculus positioned at the right ureteropelvic junction. 2. Additional nonobstructing calculi in the interpolar and lower pole right kidney. 3. Short segment of circumferentially thickened distal sigmoid colon in a region of multiple colonic diverticula without acute pericolonic inflammation, favored to reflect sequela of prior diverticulitis. However, recommend correlation with colonoscopy to exclude underlying mass. 4. Aortic Atherosclerosis (ICD10-I70.0). 5. Scoliotic curvature of the spine and multilevel degenerative changes, maximal L4-5. Electronically Signed   By: Lovena Le M.D.   On: 04/08/2019 01:51     ____________________________________________   PROCEDURES  Procedure(s) performed: None Procedures Critical Care performed:  None ____________________________________________  INITIAL IMPRESSION / ASSESSMENT AND PLAN / ED COURSE  58 y.o. male with history of hypertension, hyperlipidemia, diabetes, DVT who presents for evaluation of right flank pain.  CT showing moderate right-sided hydronephrosis with a 9 x 19 mm ovoid calculus at the right UPJ.  Labs showing no acute kidney injury or overlying UTI.  Will give Flomax, Toradol, oxycodone, Tylenol and reassess for possible outpatient follow-up if pain is well controlled.  _________________________ 4:51 AM on 04/08/2019 -----------------------------------------  Pain is well controlled.  I sent a email to Dr. Lonna CobbStoioff to ensure close follow-up as patient will most likely need lithotripsy and/or stenting to pass such large stone.  I told patient to call Dr. Heywood FootmanStoioff's office this morning for close follow-up.  I discussed  return precautions for uncontrolled pain, uncontrolled nausea vomiting, or signs of overlying infection.  Patient be discharged home on Percocet, ibuprofen, Zofran, and Flomax.  I also explained to the patient the findings of the CT concerning for possible scarring from old diverticulitis bouts versus a mass in the sigmoid colon.  I am referring him to GI for outpatient colonoscopy to ensure this is not cancer.  Patient will call for an appointment.       As part of my medical decision making, I reviewed the following data within the electronic MEDICAL RECORD NUMBER Nursing notes reviewed and incorporated, Labs reviewed , Old chart reviewed, Radiograph reviewed , Notes from prior ED visits and [place] Controlled Substance Database   Patient was evaluated in Emergency Department today for the symptoms described in the history of present illness. Patient was evaluated in the context of the global COVID-19 pandemic, which necessitated consideration that the patient might be at risk for infection with the SARS-CoV-2 virus that causes COVID-19. Institutional protocols and algorithms that pertain to the evaluation of patients at risk for COVID-19 are in a state of rapid change based on information released by regulatory bodies including the CDC and federal and state organizations. These policies and algorithms were followed during the patient's care in the ED.   ____________________________________________   FINAL CLINICAL IMPRESSION(S) / ED DIAGNOSES   Final diagnoses:  Kidney stone      NEW MEDICATIONS STARTED DURING THIS VISIT:  ED Discharge Orders         Ordered    tamsulosin (FLOMAX) 0.4 MG CAPS capsule  Daily     04/08/19 0443    oxyCODONE-acetaminophen (PERCOCET) 5-325 MG tablet  Every 4 hours PRN     04/08/19 0443    ondansetron (ZOFRAN ODT) 4 MG disintegrating tablet  Every 8 hours PRN     04/08/19 0443    ibuprofen (ADVIL) 800 MG tablet  Every 8 hours PRN     04/08/19 0443            Note:  This document was prepared using Dragon voice recognition software and may include unintentional dictation errors.    Nita SickleVeronese, [place], MD 04/08/19 [phone]

## 2019-04-09 ENCOUNTER — Ambulatory Visit: Payer: BC Managed Care – PPO | Admitting: Urology

## 2019-04-09 ENCOUNTER — Other Ambulatory Visit: Payer: Self-pay | Admitting: Radiology

## 2019-04-09 ENCOUNTER — Encounter: Payer: Self-pay | Admitting: Urology

## 2019-04-09 VITALS — BP 167/104 | HR 87 | Ht 70.0 in | Wt 219.0 lb

## 2019-04-09 DIAGNOSIS — N1330 Unspecified hydronephrosis: Secondary | ICD-10-CM

## 2019-04-09 DIAGNOSIS — N200 Calculus of kidney: Secondary | ICD-10-CM | POA: Diagnosis not present

## 2019-04-09 LAB — URINALYSIS, COMPLETE
Bilirubin, UA: NEGATIVE
Glucose, UA: NEGATIVE
Ketones, UA: NEGATIVE
Nitrite, UA: NEGATIVE
Protein,UA: NEGATIVE
Specific Gravity, UA: 1.025 (ref 1.005–1.030)
Urobilinogen, Ur: 0.2 mg/dL (ref 0.2–1.0)
pH, UA: 5 (ref 5.0–7.5)

## 2019-04-09 LAB — MICROSCOPIC EXAMINATION

## 2019-04-09 NOTE — Patient Instructions (Signed)
Laser Therapy for Kidney Stones Laser therapy for kidney stones is a procedure to break up small, hard mineral deposits that form in the kidney (kidney stones). The procedure is done using a device that produces a focused beam of light (laser). The laser breaks up kidney stones into pieces that are small enough to be passed out of the body through urination or removed from the body during the procedure. You may need laser therapy if you have kidney stones that are painful or block your urinary tract. This procedure is done by inserting a tube (ureteroscope) into your kidney through the urethral opening. The urethra is the part of the body that drains urine from the bladder. In women, the urethra opens above the vaginal opening. In men, the urethra opens at the tip of the penis. The ureteroscope is inserted through the urethra, and surgical instruments are moved through the bladder and the muscular tube that connects the kidney to the bladder (ureter) until they reach the kidney. Tell a health care provider about:  Any allergies you have.  All medicines you are taking, including vitamins, herbs, eye drops, creams, and over-the-counter medicines.  Any problems you or family members have had with anesthetic medicines.  Any blood disorders you have.  Any surgeries you have had.  Any medical conditions you have.  Whether you are pregnant or may be pregnant. What are the risks? Generally, this is a safe procedure. However, problems may occur, including:  Infection.  Bleeding.  Allergic reactions to medicines.  Damage to the urethra, bladder, or ureter.  Urinary tract infection (UTI).  Narrowing of the urethra (urethral stricture).  Difficulty passing urine.  Blockage of the kidney caused by a fragment of kidney stone. What happens before the procedure? Medicines  Ask your health care provider about: ? Changing or stopping your regular medicines. This is especially important if you  are taking diabetes medicines or blood thinners. ? Taking medicines such as aspirin and ibuprofen. These medicines can thin your blood. Do not take these medicines unless your health care provider tells you to take them. ? Taking over-the-counter medicines, vitamins, herbs, and supplements. Eating and drinking Follow instructions from your health care provider about eating and drinking, which may include:  8 hours before the procedure - stop eating heavy meals or foods, such as meat, fried foods, or fatty foods.  6 hours before the procedure - stop eating light meals or foods, such as toast or cereal.  6 hours before the procedure - stop drinking milk or drinks that contain milk.  2 hours before the procedure - stop drinking clear liquids. Staying hydrated Follow instructions from your health care provider about hydration, which may include:  Up to 2 hours before the procedure - you may continue to drink clear liquids, such as water, clear fruit juice, black coffee, and plain tea.  General instructions  You may have a physical exam before the procedure. You may also have tests, such as imaging tests and blood or urine tests.  If your ureter is too narrow, your health care provider may place a soft, flexible tube (stent) inside of it. The stent may be placed days or weeks before your laser therapy procedure.  Plan to have someone take you home from the hospital or clinic.  If you will be going home right after the procedure, plan to have someone stay with you for 24 hours.  Do not use any products that contain nicotine or tobacco for at least 4   weeks before the procedure. These products include cigarettes, e-cigarettes, and chewing tobacco. If you need help quitting, ask your health care provider.  Ask your health care provider: ? How your surgical site will be marked or identified. ? What steps will be taken to help prevent infection. These may include:  Removing hair at the surgery  site.  Washing skin with a germ-killing soap.  Taking antibiotic medicine. What happens during the procedure?   An IV will be inserted into one of your veins.  You will be given one or more of the following: ? A medicine to help you relax (sedative). ? A medicine to numb the area (local anesthetic). ? A medicine to make you fall asleep (general anesthetic).  A ureteroscope will be inserted into your urethra. The ureteroscope will send images to a video screen in the operating room to guide your surgeon to the area of your kidney that will be treated.  A small, flexible tube will be threaded through the ureteroscope and into your bladder and ureter, up to your kidney.  The laser device will be inserted into your kidney through the tube. Your surgeon will pulse the laser on and off to break up kidney stones.  A surgical instrument that has a tiny wire basket may be inserted through the tube into your kidney to remove the pieces of broken kidney stone. The procedure may vary among health care providers and hospitals. What happens after the procedure?  Your blood pressure, heart rate, breathing rate, and blood oxygen level will be monitored until you leave the hospital or clinic.  You will be given pain medicine as needed.  You may continue to receive antibiotics.  You may have a stent temporarily placed in your ureter.  Do not drive for 24 hours if you were given a sedative during your procedure.  You may be given a strainer to collect any stone fragments that you pass in your urine. Your health care provider may have these tested. Summary  Laser therapy for kidney stones is a procedure to break up kidney stones into pieces that are small enough to be passed out of the body through urination or removed during the procedure.  Follow instructions from your health care provider about eating and drinking before the procedure.  During the procedure, the ureteroscope will send images  to a video screen to guide your surgeon to the area of your kidney that will be treated.  Do not drive for 24 hours if you were given a sedative during your procedure. This information is not intended to replace advice given to you by your health care provider. Make sure you discuss any questions you have with your health care provider. Document Released: 07/22/2015 Document Revised: 03/06/2018 Document Reviewed: 03/06/2018 Elsevier Patient Education  2020 [employer]   Ureteral Stent Implantation  Ureteral stent implantation is a procedure to insert (implant) a flexible, soft, plastic tube (stent) into a ureter. Ureters are the tube-like parts of the body that drain urine from the kidneys. The stent supports the ureter while it heals and helps to drain urine. You may have a ureteral stent implanted after having a procedure to remove a blockage from the ureter (ureterolysis or pyeloplasty). You may also have a stent implanted to open the flow of urine when you have a blockage caused by a kidney stone, tumor, blood clot, or infection. You have two ureters, one on each side of the body. The ureters connect the kidneys to the organ   that holds urine until it passes out of the body (bladder). The stent is placed so that one end is in the kidney, and one end is in the bladder. The stent is usually taken out after your ureter has healed. Depending on your condition, you may have a stent for just a few weeks, or you may have a long-term stent that will need to be replaced every few months. Tell a health care provider about:  Any allergies you have.  All medicines you are taking, including vitamins, herbs, eye drops, creams, and over-the-counter medicines.  Any problems you or family members have had with anesthetic medicines.  Any blood disorders you have.  Any surgeries you have had.  Any medical conditions you have.  Whether you are pregnant or may be pregnant. What are the risks? Generally,  this is a safe procedure. However, problems may occur, including:  Infection.  Bleeding.  Allergic reactions to medicines.  Damage to other structures or organs. Tearing (perforation) of the ureter is possible.  Movement of the stent away from where it is placed during surgery (migration). What happens before the procedure? Medicines Ask your health care provider about:  Changing or stopping your regular medicines. This is especially important if you are taking diabetes medicines or blood thinners.  Taking medicines such as aspirin and ibuprofen. These medicines can thin your blood. Do not take these medicines unless your health care provider tells you to take them.  Taking over-the-counter medicines, vitamins, herbs, and supplements. Eating and drinking Follow instructions from your health care provider about eating and drinking, which may include:  8 hours before the procedure - stop eating heavy meals or foods, such as meat, fried foods, or fatty foods.  6 hours before the procedure - stop eating light meals or foods, such as toast or cereal.  6 hours before the procedure - stop drinking milk or drinks that contain milk.  2 hours before the procedure - stop drinking clear liquids. Staying hydrated Follow instructions from your health care provider about hydration, which may include:  Up to 2 hours before the procedure - you may continue to drink clear liquids, such as water, clear fruit juice, black coffee, and plain tea. General instructions  Do not drink alcohol.  Do not use any products that contain nicotine or tobacco for at least 4 weeks before the procedure. These products include cigarettes, e-cigarettes, and chewing tobacco. If you need help quitting, ask your health care provider.  You may have an exam or testing, such as imaging or blood tests.  Ask your health care provider what steps will be taken to help prevent infection. These may include: ? Removing hair  at the surgery site. ? Washing skin with a germ-killing soap. ? Taking antibiotic medicine.  Plan to have someone take you home from the hospital or clinic.  If you will be going home right after the procedure, plan to have someone with you for 24 hours. What happens during the procedure?  An IV will be inserted into one of your veins.  You may be given a medicine to help you relax (sedative).  You may be given a medicine to make you fall asleep (general anesthetic).  A thin, tube-shaped instrument with a light and tiny camera at the end (cystoscope) will be inserted into your urethra. The urethra is the tube that drains urine from the bladder out of the body. In men, the urethra opens at the end of the penis. In women,   the urethra opens in front of the vaginal opening.  The cystoscope will be passed into your bladder.  A thin wire (guide wire) will be passed through your bladder and into your ureter. This is used to guide the stent into your ureter.  The stent will be inserted into your ureter.  The guide wire and the cystoscope will be removed.  A flexible tube (catheter) may be inserted through your urethra so that one end is in your bladder. This helps to drain urine from your bladder. The procedure may vary among hospitals and health care providers. What happens after the procedure?  Your blood pressure, heart rate, breathing rate, and blood oxygen level will be monitored until you leave the hospital or clinic.  You may continue to receive medicine and fluids through an IV.  You may have some soreness or pain in your abdomen and urethra. Medicines will be available to help you.  You will be encouraged to get up and walk around as soon as you can.  You may have a catheter draining your urine.  You will have some blood in your urine.  Do not drive for 24 hours if you were given a sedative during your procedure. Summary  Ureteral stent implantation is a procedure to  insert a flexible, soft, plastic tube (stent) into a ureter.  You may have a stent implanted to support the ureter while it heals after a procedure or to open the flow of urine if there is a blockage.  Follow instructions from your health care provider about taking medicines and about eating and drinking before the procedure.  Depending on your condition, you may have a stent for just a few weeks, or you may have a long-term stent that will need to be replaced every few months. This information is not intended to replace advice given to you by your health care provider. Make sure you discuss any questions you have with your health care provider. Document Released: 06/22/2000 Document Revised: 04/01/2018 Document Reviewed: 04/02/2018 Elsevier Patient Education  2020 Elsevier Inc.   

## 2019-04-09 NOTE — Progress Notes (Signed)
04/09/19 12:43 PM   Michael Giles 05-07-1961 [id number]  Referring provider: Roselee Nova, MD [address] [place],  [place] [address]  CC: Right renal colic  HPI: I saw Michael Giles in urology clinic today for evaluation of right renal colic.  He is a 58 year old male with chronic back pain on narcotics who presented to the ED yesterday with a week of intermittent severe right renal colic that radiated to the groin.  He denies any prior history of nephrolithiasis.  He denies any gross hematuria, fevers, chills, or dysuria.  CT stone protocol dated 04/08/2019 showed a 1.9 cm UPJ stone with some hydronephrosis, as well as an additional 1 cm lower pole stone.  There is no left nephrolithiasis.  Labs were benign and urinalysis was non-infected.  His pain is been adequately controlled with ibuprofen and narcotics.  There are no aggravating or alleviating factors.  Severity is moderate.   PMH: Past Medical History:  Diagnosis Date   Arthritis    Degeneration of lumbar or lumbosacral intervertebral disc    Diabetes mellitus without complication ([place])    DVT (deep venous thrombosis) ([place])    H/O UPPER LEFT LEG   History of angioplasty    Hyperlipidemia    Hypertension    Impotence of organic origin    Opioid type dependence, continuous ([place])    Other symptoms involving cardiovascular system    Panic disorder without agoraphobia    Postlaminectomy syndrome, lumbar region    Spasm of muscle    Unspecified vitamin D deficiency     Surgical History: Past Surgical History:  Procedure Laterality Date   ARTHRODESIS  2003   [place]  2010   [facility]/65% blockage left leg no stent   CATARACT EXTRACTION W/PHACO Right 05/02/2017   Procedure: CATARACT EXTRACTION PHACO AND INTRAOCULAR LENS PLACEMENT ([place]);  Surgeon: Eulogio Bear, MD;  Location: [facility] ORS;  Service: Ophthalmology;  Laterality: Right;  [place]  01:20.0 AP%20.0 CDE16.19 Fluid pack lot # [id number]   INGUINAL HERNIA REPAIR  2003   LAMINECTOMY      Allergies:  Allergies  Allergen Reactions   Crestor [Rosuvastatin]     joint pain    Family History: Family History  Problem Relation Age of Onset   Heart disease Father    Heart attack Father     Social History:  reports that he has been smoking cigarettes. He has a 48.00 pack-year smoking history. He has never used smokeless tobacco. He reports that he does not drink alcohol or use drugs.  ROS: Please see flowsheet from today's date for complete review of systems.  Physical Exam: BP (!) 167/104 (BP Location: Left Arm, Patient Position: Sitting, Cuff Size: Normal)    Pulse 87    Ht 5\' 10"  (1.778 m)    Wt 219 lb (99.3 kg)    BMI 31.42 kg/m    Constitutional:  Alert and oriented, No acute distress. Cardiovascular: Regular rate and rhythm Respiratory: Clear to auscultation bilaterally GI: Abdomen is soft, nontender, nondistended, no abdominal masses GU: Right CVA tenderness, phallus without lesions Lymph: No cervical or inguinal lymphadenopathy. Skin: No rashes, bruises or suspicious lesions. Neurologic: Grossly intact, no focal deficits, moving all 4 extremities. Psychiatric: Normal mood and affect.  Laboratory Data: Reviewed Urine sent for culture today  Pertinent Imaging: I have personally reviewed the CT dated 04/08/2019.  There is a 1.9 cm right renal pelvis stone with hydronephrosis, there is  an additional 1 cm lower pole stone.  No left-sided nephrolithiasis.  Assessment & Plan:   In summary, the patient is a 59 year old male with chronic pain on narcotics who presents with a 1.9 cm right UPJ stone and flank pain.  He has no clinical or laboratory signs of infection.  We discussed various treatment options for urolithiasis including observation with or without medical expulsive therapy, shockwave lithotripsy (SWL), ureteroscopy and laser lithotripsy with  stent placement, and percutaneous nephrolithotomy.  We discussed that management is based on stone size, location, density, patient co-morbidities, and patient preference.   Stones <57mm in size have a >80% spontaneous passage rate. Data surrounding the use of tamsulosin for medical expulsive therapy is controversial, but meta analyses suggests it is most efficacious for distal stones between 5-4mm in size. Possible side effects include dizziness/lightheadedness, and retrograde ejaculation.  SWL has a lower stone free rate in a single procedure, but also a lower complication rate compared to ureteroscopy and avoids a stent and associated stent related symptoms.  His stone is quite dense at 1400HU, and I do not think shockwave would be his best option.  Possible complications include renal hematoma, steinstrasse, and need for additional treatment.  Ureteroscopy with laser lithotripsy and stent placement has a higher stone free rate than SWL in a single procedure, however increased complication rate including possible infection, ureteral injury, bleeding, and stent related morbidity. Common stent related symptoms include dysuria, urgency/frequency, and flank pain.  PCNL is the favored treatment for stones >2cm. It involves a small incision in the flank, with complete fragmentation of stones and removal. It has the highest stone free rate, but also the highest complication rate. Possible complications include bleeding, infection/sepsis, injury to surrounding organs including the pleura, and collecting system injury.   After an extensive discussion of the risks and benefits of the above treatment options, the patient would like to proceed with ureteroscopy, laser lithotripsy, stent.   Sondra Come, MD  [facility] [address], [address] [place], [place] [address] [phone]

## 2019-04-13 ENCOUNTER — Other Ambulatory Visit
Admission: RE | Admit: 2019-04-13 | Discharge: 2019-04-13 | Disposition: A | Discharge status: Home / Self Care | Payer: BC Managed Care – PPO | Source: Ambulatory Visit | Attending: Urology | Admitting: Urology

## 2019-04-13 ENCOUNTER — Other Ambulatory Visit: Payer: Self-pay

## 2019-04-13 DIAGNOSIS — Z20828 Contact with and (suspected) exposure to other viral communicable diseases: Secondary | ICD-10-CM | POA: Diagnosis not present

## 2019-04-13 DIAGNOSIS — Z79891 Long term (current) use of opiate analgesic: Secondary | ICD-10-CM | POA: Diagnosis not present

## 2019-04-13 DIAGNOSIS — G894 Chronic pain syndrome: Secondary | ICD-10-CM | POA: Diagnosis not present

## 2019-04-13 DIAGNOSIS — M545 Low back pain: Secondary | ICD-10-CM | POA: Diagnosis not present

## 2019-04-13 DIAGNOSIS — Z01818 Encounter for other preprocedural examination: Secondary | ICD-10-CM | POA: Insufficient documentation

## 2019-04-13 DIAGNOSIS — M5416 Radiculopathy, lumbar region: Secondary | ICD-10-CM | POA: Diagnosis not present

## 2019-04-13 HISTORY — DX: Unilateral inguinal hernia, without obstruction or gangrene, not specified as recurrent: K40.90

## 2019-04-13 LAB — SARS CORONAVIRUS 2 (TAT 6-24 HRS): SARS Coronavirus 2: NEGATIVE

## 2019-04-13 NOTE — Patient Instructions (Signed)
Your procedure is scheduled on: Thursday, April 16, 2019 Report to Day Surgery on the 2nd floor of the [facility]. To find out your arrival time, please call [phone] between 1PM - 3PM on: Wednesday, October 7  REMEMBER: Instructions that are not followed completely may result in serious medical risk, up to and including death; or upon the discretion of your surgeon and anesthesiologist your surgery may need to be rescheduled.  Do not eat food after midnight the night before surgery.  No gum chewing, lozengers or hard candies.  You may however, drink CLEAR liquids up to 2 hours before you are scheduled to arrive for your surgery. Do not drink anything within 2 hours of the start of your surgery.  Clear liquids include: - water  - apple juice without pulp - gatorade - black coffee or tea (Do NOT add milk or creamers to the coffee or tea) Do NOT drink anything that is not on this list.  No Alcohol for 24 hours before or after surgery.  No Smoking including e-cigarettes for 24 hours prior to surgery.  No chewable tobacco products for at least 6 hours prior to surgery.  No nicotine patches on the day of surgery.  On the morning of surgery brush your teeth with toothpaste and water, you may rinse your mouth with mouthwash if you wish. Do not swallow any toothpaste or mouthwash.  Notify your doctor if there is any change in your medical condition (cold, fever, infection).  Do not wear jewelry, make-up, hairpins, clips or nail polish.  Do not wear lotions, powders, or perfumes.   Do not shave 48 hours prior to surgery.   Contacts and dentures may not be worn into surgery.  Do not bring valuables to the hospital, including drivers license, insurance or credit cards.  [facility] is not responsible for any belongings or valuables.   TAKE THESE MEDICATIONS THE MORNING OF SURGERY:  1.  Oxycodone  NOW!  Stop ASPIRIN, PLETAL, IBUPROFEN and Anti-inflammatories (NSAIDS) such as  Advil, Aleve, Ibuprofen, Motrin, Naproxen, Naprosyn and Aspirin based products such as Excedrin, Goodys Powder, BC Powder. (May take Tylenol or Acetaminophen if needed.)  Stop ANY OVER THE COUNTER supplements until after surgery.  If you are being discharged the day of surgery, you will not be allowed to drive home. You will need a responsible adult to drive you home and stay with you that night.   If you are taking public transportation, you will need to have a responsible adult with you. Please confirm with your physician that it is acceptable to use public transportation.   Please call [phone] if you have any questions about these instructions.

## 2019-04-14 ENCOUNTER — Encounter
Admission: RE | Admit: 2019-04-14 | Discharge: 2019-04-14 | Disposition: A | Discharge status: Home / Self Care | Payer: BC Managed Care – PPO | Source: Ambulatory Visit | Attending: Urology | Admitting: Urology

## 2019-04-14 DIAGNOSIS — Z01810 Encounter for preprocedural cardiovascular examination: Secondary | ICD-10-CM | POA: Insufficient documentation

## 2019-04-15 MED ORDER — CEFAZOLIN SODIUM-DEXTROSE 2-4 GM/100ML-% IV SOLN
2.0000 g | INTRAVENOUS | Status: AC
  Administered 2019-04-16: 2 g via INTRAVENOUS

## 2019-04-16 ENCOUNTER — Ambulatory Visit: Payer: BC Managed Care – PPO | Admitting: Anesthesiology

## 2019-04-16 ENCOUNTER — Encounter
Admission: RE | Disposition: A | Discharge status: Home / Self Care | Payer: Self-pay | Source: Home / Self Care | Attending: Urology

## 2019-04-16 ENCOUNTER — Other Ambulatory Visit: Payer: Self-pay

## 2019-04-16 ENCOUNTER — Ambulatory Visit
Admission: RE | Admit: 2019-04-16 | Discharge: 2019-04-16 | Disposition: A | Discharge status: Home / Self Care | Payer: BC Managed Care – PPO | Attending: Urology | Admitting: Urology

## 2019-04-16 ENCOUNTER — Encounter: Payer: Self-pay | Admitting: *Deleted

## 2019-04-16 DIAGNOSIS — Z7982 Long term (current) use of aspirin: Secondary | ICD-10-CM | POA: Insufficient documentation

## 2019-04-16 DIAGNOSIS — N201 Calculus of ureter: Secondary | ICD-10-CM | POA: Insufficient documentation

## 2019-04-16 DIAGNOSIS — Z79899 Other long term (current) drug therapy: Secondary | ICD-10-CM | POA: Insufficient documentation

## 2019-04-16 DIAGNOSIS — Z86718 Personal history of other venous thrombosis and embolism: Secondary | ICD-10-CM | POA: Diagnosis not present

## 2019-04-16 DIAGNOSIS — E785 Hyperlipidemia, unspecified: Secondary | ICD-10-CM | POA: Diagnosis not present

## 2019-04-16 DIAGNOSIS — N1330 Unspecified hydronephrosis: Secondary | ICD-10-CM

## 2019-04-16 DIAGNOSIS — N200 Calculus of kidney: Secondary | ICD-10-CM

## 2019-04-16 DIAGNOSIS — E1151 Type 2 diabetes mellitus with diabetic peripheral angiopathy without gangrene: Secondary | ICD-10-CM | POA: Diagnosis not present

## 2019-04-16 DIAGNOSIS — I739 Peripheral vascular disease, unspecified: Secondary | ICD-10-CM | POA: Insufficient documentation

## 2019-04-16 DIAGNOSIS — I10 Essential (primary) hypertension: Secondary | ICD-10-CM | POA: Diagnosis not present

## 2019-04-16 DIAGNOSIS — E119 Type 2 diabetes mellitus without complications: Secondary | ICD-10-CM | POA: Diagnosis not present

## 2019-04-16 DIAGNOSIS — F17210 Nicotine dependence, cigarettes, uncomplicated: Secondary | ICD-10-CM | POA: Diagnosis not present

## 2019-04-16 HISTORY — PX: CYSTOSCOPY WITH URETEROSCOPY AND STENT PLACEMENT: SHX6377

## 2019-04-16 LAB — GLUCOSE, CAPILLARY: Glucose-Capillary: 85 mg/dL (ref 70–99)

## 2019-04-16 SURGERY — CYSTOURETEROSCOPY, WITH STENT INSERTION
Anesthesia: General | Laterality: Right

## 2019-04-16 MED ORDER — BELLADONNA ALKALOIDS-OPIUM 16.2-60 MG RE SUPP
RECTAL | Status: DC | PRN
  Administered 2019-04-16: 1 via RECTAL

## 2019-04-16 MED ORDER — DEXAMETHASONE SODIUM PHOSPHATE 10 MG/ML IJ SOLN
INTRAMUSCULAR | Status: DC | PRN
  Administered 2019-04-16: 10 mg via INTRAVENOUS

## 2019-04-16 MED ORDER — OXYCODONE-ACETAMINOPHEN 5-325 MG PO TABS: 1.0000 | ORAL_TABLET | ORAL | 0 refills | Status: DC | PRN

## 2019-04-16 MED ORDER — KETOROLAC TROMETHAMINE 30 MG/ML IJ SOLN
INTRAMUSCULAR | Status: AC
  Filled 2019-04-16: qty 1

## 2019-04-16 MED ORDER — ONDANSETRON HCL 4 MG/2ML IJ SOLN
INTRAMUSCULAR | Status: DC | PRN
  Administered 2019-04-16: 4 mg via INTRAVENOUS

## 2019-04-16 MED ORDER — CEFAZOLIN SODIUM-DEXTROSE 2-4 GM/100ML-% IV SOLN
INTRAVENOUS | Status: AC
  Filled 2019-04-16: qty 100

## 2019-04-16 MED ORDER — FAMOTIDINE 20 MG PO TABS
20.0000 mg | ORAL_TABLET | Freq: Once | ORAL | Status: AC
  Administered 2019-04-16: 16:00:00 20 mg via ORAL

## 2019-04-16 MED ORDER — ONDANSETRON HCL 4 MG/2ML IJ SOLN: 4.0000 mg | Freq: Once | INTRAMUSCULAR | Status: DC | PRN

## 2019-04-16 MED ORDER — MIDAZOLAM HCL 2 MG/2ML IJ SOLN
INTRAMUSCULAR | Status: AC
  Filled 2019-04-16: qty 2

## 2019-04-16 MED ORDER — SUCCINYLCHOLINE CHLORIDE 20 MG/ML IJ SOLN
INTRAMUSCULAR | Status: AC
  Filled 2019-04-16: qty 1

## 2019-04-16 MED ORDER — SUCCINYLCHOLINE CHLORIDE 20 MG/ML IJ SOLN
INTRAMUSCULAR | Status: DC | PRN
  Administered 2019-04-16: 100 mg via INTRAVENOUS

## 2019-04-16 MED ORDER — FENTANYL CITRATE (PF) 100 MCG/2ML IJ SOLN
INTRAMUSCULAR | Status: AC
  Administered 2019-04-16: 25 ug via INTRAVENOUS
  Filled 2019-04-16: qty 2

## 2019-04-16 MED ORDER — PROPOFOL 10 MG/ML IV BOLUS
INTRAVENOUS | Status: DC | PRN
  Administered 2019-04-16: 150 mg via INTRAVENOUS

## 2019-04-16 MED ORDER — SUGAMMADEX SODIUM 200 MG/2ML IV SOLN
INTRAVENOUS | Status: DC | PRN
  Administered 2019-04-16: 400 mg via INTRAVENOUS

## 2019-04-16 MED ORDER — DEXAMETHASONE SODIUM PHOSPHATE 10 MG/ML IJ SOLN
INTRAMUSCULAR | Status: AC
  Filled 2019-04-16: qty 1

## 2019-04-16 MED ORDER — KETOROLAC TROMETHAMINE 30 MG/ML IJ SOLN
INTRAMUSCULAR | Status: DC | PRN
  Administered 2019-04-16: 15 mg via INTRAVENOUS

## 2019-04-16 MED ORDER — FENTANYL CITRATE (PF) 100 MCG/2ML IJ SOLN
INTRAMUSCULAR | Status: DC | PRN
  Administered 2019-04-16 (×2): 50 ug via INTRAVENOUS

## 2019-04-16 MED ORDER — ROCURONIUM BROMIDE 100 MG/10ML IV SOLN
INTRAVENOUS | Status: DC | PRN
  Administered 2019-04-16: 50 mg via INTRAVENOUS

## 2019-04-16 MED ORDER — ROCURONIUM BROMIDE 50 MG/5ML IV SOLN
INTRAVENOUS | Status: AC
  Filled 2019-04-16: qty 1

## 2019-04-16 MED ORDER — FENTANYL CITRATE (PF) 100 MCG/2ML IJ SOLN
25.0000 ug | INTRAMUSCULAR | Status: AC | PRN
  Administered 2019-04-16 (×6): 25 ug via INTRAVENOUS

## 2019-04-16 MED ORDER — PROPOFOL 10 MG/ML IV BOLUS
INTRAVENOUS | Status: AC
  Filled 2019-04-16: qty 20

## 2019-04-16 MED ORDER — LIDOCAINE HCL (PF) 2 % IJ SOLN
INTRAMUSCULAR | Status: AC
  Filled 2019-04-16: qty 10

## 2019-04-16 MED ORDER — LABETALOL HCL 5 MG/ML IV SOLN
INTRAVENOUS | Status: DC | PRN
  Administered 2019-04-16: 5 mg via INTRAVENOUS

## 2019-04-16 MED ORDER — BELLADONNA ALKALOIDS-OPIUM 16.2-60 MG RE SUPP
RECTAL | Status: AC
  Filled 2019-04-16: qty 1

## 2019-04-16 MED ORDER — MIDAZOLAM HCL 2 MG/2ML IJ SOLN
INTRAMUSCULAR | Status: DC | PRN
  Administered 2019-04-16: 2 mg via INTRAVENOUS

## 2019-04-16 MED ORDER — LABETALOL HCL 5 MG/ML IV SOLN: 5.0000 mg | INTRAVENOUS | Status: DC | PRN

## 2019-04-16 MED ORDER — FAMOTIDINE 20 MG PO TABS
ORAL_TABLET | ORAL | Status: AC
  Administered 2019-04-16: 20 mg via ORAL
  Filled 2019-04-16: qty 1

## 2019-04-16 MED ORDER — LIDOCAINE HCL (CARDIAC) PF 100 MG/5ML IV SOSY
PREFILLED_SYRINGE | INTRAVENOUS | Status: DC | PRN
  Administered 2019-04-16: 100 mg via INTRAVENOUS

## 2019-04-16 MED ORDER — ONDANSETRON HCL 4 MG/2ML IJ SOLN
INTRAMUSCULAR | Status: AC
  Filled 2019-04-16: qty 2

## 2019-04-16 MED ORDER — SODIUM CHLORIDE 0.9 % IV SOLN
INTRAVENOUS | Status: DC
  Administered 2019-04-16: 16:00:00 via INTRAVENOUS

## 2019-04-16 MED ORDER — PHENYLEPHRINE HCL (PRESSORS) 10 MG/ML IV SOLN
INTRAVENOUS | Status: DC | PRN
  Administered 2019-04-16: 100 ug via INTRAVENOUS

## 2019-04-16 MED ORDER — FENTANYL CITRATE (PF) 100 MCG/2ML IJ SOLN
INTRAMUSCULAR | Status: AC
  Filled 2019-04-16: qty 2

## 2019-04-16 MED ORDER — OXYBUTYNIN CHLORIDE ER 10 MG PO TB24: 10.0000 mg | ORAL_TABLET | Freq: Every day | ORAL | 0 refills | Status: AC | PRN

## 2019-04-16 SURGICAL SUPPLY — 31 items
BAG DRAIN CYSTO-URO LG1000N (MISCELLANEOUS) ×3 IMPLANT
BRUSH SCRUB EZ 1% IODOPHOR (MISCELLANEOUS) ×3 IMPLANT
CATH URETL 5X70 OPEN END (CATHETERS) IMPLANT
CNTNR SPEC 2.5X3XGRAD LEK (MISCELLANEOUS)
CONT SPEC 4OZ STER OR WHT (MISCELLANEOUS)
CONTAINER SPEC 2.5X3XGRAD LEK (MISCELLANEOUS) IMPLANT
DILATOR UROMAX ULTRA (MISCELLANEOUS) ×2 IMPLANT
DRAPE UTILITY 15X26 TOWEL STRL (DRAPES) ×3 IMPLANT
FIBER LASER TRAC TIP (UROLOGICAL SUPPLIES) IMPLANT
GLOVE BIOGEL PI IND STRL 7.5 (GLOVE) ×1 IMPLANT
GLOVE BIOGEL PI INDICATOR 7.5 (GLOVE) ×2
GOWN STRL REUS W/ TWL LRG LVL3 (GOWN DISPOSABLE) ×1 IMPLANT
GOWN STRL REUS W/ TWL XL LVL3 (GOWN DISPOSABLE) ×1 IMPLANT
GOWN STRL REUS W/TWL LRG LVL3 (GOWN DISPOSABLE) ×2
GOWN STRL REUS W/TWL XL LVL3 (GOWN DISPOSABLE) ×2
GUIDEWIRE STR DUAL SENSOR (WIRE) ×3 IMPLANT
INFUSOR MANOMETER BAG 3000ML (MISCELLANEOUS) ×3 IMPLANT
INTRODUCER DILATOR DOUBLE (INTRODUCER) IMPLANT
KIT TURNOVER CYSTO (KITS) ×3 IMPLANT
PACK CYSTO AR (MISCELLANEOUS) ×3 IMPLANT
SET CYSTO W/LG BORE CLAMP LF (SET/KITS/TRAYS/PACK) ×3 IMPLANT
SHEATH URETERAL 12FRX35CM (MISCELLANEOUS) IMPLANT
SOL .9 NS 3000ML IRR  AL (IV SOLUTION) ×2
SOL .9 NS 3000ML IRR UROMATIC (IV SOLUTION) ×1 IMPLANT
STENT URET 6FRX24 CONTOUR (STENTS) IMPLANT
STENT URET 6FRX26 CONTOUR (STENTS) IMPLANT
STENT URET 6FRX28 CONTOUR (STENTS) ×2 IMPLANT
SURGILUBE 2OZ TUBE FLIPTOP (MISCELLANEOUS) ×3 IMPLANT
SYR 10ML LL (SYRINGE) ×3 IMPLANT
VALVE UROSEAL ADJ ENDO (VALVE) IMPLANT
WATER STERILE IRR 1000ML POUR (IV SOLUTION) ×3 IMPLANT

## 2019-04-16 NOTE — Anesthesia Post-op Follow-up Note (Signed)
Anesthesia QCDR form completed.        

## 2019-04-16 NOTE — H&P (Signed)
UROLOGY H&P UPDATE  Agree with prior H&P dated 04/09/2019. Large right UPJ stone and renal colic.  Cardiac: RRR Lungs: CTA bilaterally  Laterality: RIGHT Procedure: ureteroscopy, laser lithotripsy, stent placement  Urine: urinalysis 10/1 nitrite negative, 6-10 WBCs, 3+ RBCs, few bacteria  We specifically discussed the risks ureteroscopy including bleeding, infection/sepsis, stent related symptoms including flank pain/urgency/frequency/incontinence/dysuria, ureteral injury, inability to access stone, or need for staged or additional procedures, especially with his significant stone burden.   Billey Co, MD 04/16/2019

## 2019-04-16 NOTE — Transfer of Care (Signed)
Immediate Anesthesia Transfer of Care Note  Patient: Knowledge F Oien  Procedure(s) Performed: CYSTOSCOPY WITH URETEROSCOPY AND STENT PLACEMENT (Right )  Patient Location: PACU  Anesthesia Type:General  Level of Consciousness: awake, alert  and oriented  Airway & Oxygen Therapy: Patient Spontanous Breathing and Patient connected to face mask oxygen  Post-op Assessment: Report given to RN, Post -op Vital signs reviewed and stable and Patient moving all extremities X 4  Post vital signs: Reviewed and stable  Last Vitals:  Vitals Value Taken Time  BP 181/110 04/16/19 1734  Temp    Pulse 86 04/16/19 1741  Resp 11 04/16/19 1741  SpO2 100 % 04/16/19 1741  Vitals shown include unvalidated device data.  Last Pain:  Vitals:   04/16/19 1539  TempSrc: Temporal  PainSc: 0-No pain         Complications: No apparent anesthesia complications

## 2019-04-16 NOTE — Anesthesia Preprocedure Evaluation (Signed)
Anesthesia Evaluation  Patient identified by MRN, date of birth, ID band Patient awake    Reviewed: Allergy & Precautions, NPO status , Patient's Chart, lab work & pertinent test results, reviewed documented beta blocker date and time   Airway Mallampati: III  TM Distance: >3 FB     Dental  (+) Chipped   Pulmonary Current Smoker,           Cardiovascular hypertension, + Peripheral Vascular Disease and + DVT       Neuro/Psych PSYCHIATRIC DISORDERS Anxiety Depression    GI/Hepatic   Endo/Other  diabetes, Type 2  Renal/GU      Musculoskeletal  (+) Arthritis ,   Abdominal   Peds  Hematology   Anesthesia Other Findings opiod use. Smokes. Took oxycontin extended at 12 pm. Lasts 8 hours for him. EKG reviewed and ok.  Reproductive/Obstetrics                             Anesthesia Physical Anesthesia Plan  ASA: III  Anesthesia Plan: General   Post-op Pain Management:    Induction: Intravenous  PONV Risk Score and Plan:   Airway Management Planned: Oral ETT and LMA  Additional Equipment:   Intra-op Plan:   Post-operative Plan:   Informed Consent: I have reviewed the patients History and Physical, chart, labs and discussed the procedure including the risks, benefits and alternatives for the proposed anesthesia with the patient or authorized representative who has indicated his/her understanding and acceptance.       Plan Discussed with: CRNA  Anesthesia Plan Comments:         Anesthesia Quick Evaluation

## 2019-04-16 NOTE — Anesthesia Postprocedure Evaluation (Signed)
Anesthesia Post Note  Patient: Michael Giles  Procedure(s) Performed: CYSTOSCOPY WITH URETEROSCOPY AND STENT PLACEMENT (Right )  Patient location during evaluation: PACU Anesthesia Type: General Level of consciousness: awake and alert Pain management: pain level controlled Vital Signs Assessment: post-procedure vital signs reviewed and stable Respiratory status: spontaneous breathing, nonlabored ventilation, respiratory function stable and patient connected to nasal cannula oxygen Cardiovascular status: blood pressure returned to baseline and stable Postop Assessment: no apparent nausea or vomiting Anesthetic complications: no     Last Vitals:  Vitals:   04/16/19 1830 04/16/19 1843  BP:  (!) 170/90  Pulse: 72 70  Resp:  18  Temp:  36.8 C  SpO2: 94% 95%    Last Pain:  Vitals:   04/16/19 1843  TempSrc:   PainSc: [place]

## 2019-04-16 NOTE — Anesthesia Procedure Notes (Signed)
Procedure Name: Intubation Date/Time: 04/16/2019 4:50 PM Performed by: Caryl Asp, CRNA Pre-anesthesia Checklist: Patient identified, Patient being monitored, Timeout performed, Emergency Drugs available and Suction available Patient Re-evaluated:Patient Re-evaluated prior to induction Oxygen Delivery Method: Circle system utilized Preoxygenation: Pre-oxygenation with 100% oxygen Induction Type: IV induction Ventilation: Mask ventilation without difficulty Laryngoscope Size: McGraph and 4 Grade View: Grade I Tube type: Oral Tube size: 7.5 mm Number of attempts: 1 Airway Equipment and Method: Stylet and Video-laryngoscopy Placement Confirmation: ETT inserted through vocal cords under direct vision,  positive ETCO2 and breath sounds checked- equal and bilateral Secured at: 23 cm Tube secured with: Tape Dental Injury: Teeth and Oropharynx as per pre-operative assessment

## 2019-04-16 NOTE — Op Note (Signed)
Date of procedure: 04/16/19  Preoperative diagnosis:  1. Right 1.5 cm UPJ stone  Postoperative diagnosis:  1. Same  Procedure: 1. Cystoscopy, right retrograde pyelogram with intraoperative interpretation, right ureteral stent placement  Surgeon: Nickolas Madrid, MD  Anesthesia: General  Complications: None  Intraoperative findings:  1.  Diffusely narrowed urethra, able to gently dilate with 21 French rigid scope 2.  Small prostate, normal bladder mucosa 3.  Unable to pass flexible ureteroscope despite ureteral dilation, stent placed  EBL: Minimal  Specimens: None  Drains: Right 6 French by 28 cm ureteral stent  Indication: Michael Giles is a 58 y.o. patient with renal colic secondary to a 1.5 cm right UPJ stone.  After reviewing the management options for treatment, they elected to proceed with the above surgical procedure(s). We have discussed the potential benefits and risks of the procedure, side effects of the proposed treatment, the likelihood of the patient achieving the goals of the procedure, and any potential problems that might occur during the procedure or recuperation. Informed consent has been obtained.  Description of procedure:  The patient was taken to the operating room and general anesthesia was induced. SCDs were placed for DVT prophylaxis. The patient was placed in the dorsal lithotomy position, prepped and draped in the usual sterile fashion, and preoperative antibiotics(Ancef) were administered. A preoperative time-out was performed.   A 21 French rigid cystoscope was used to intubate the urethra.  The urethra was diffusely mildly narrowed but I was able to gently dilate the urethra and advanced the scope into the bladder with a 21 French rigid scope.  The prostate was small.  Bladder mucosa was grossly normal throughout.  The stone could clearly be seen at the right UPJ on fluoroscopy.  A sensor wire was advanced into the right ureteral orifice and advanced  easily into the collecting system alongside the stone.  I attempted to use a dual-lumen access catheter to gently dilate the ureter and add a second safety wire, however the access catheter met resistance at the distal ureter.  I then performed balloon dilation under fluoroscopic vision with a 15 French by 6 cm UroMax dilating balloon.  I again attempted to pass the dual-lumen access catheter, but again met resistance distally.  At this point, I attempted to pass the single-channel flexible ureteroscope over the wire, but again met resistance at the distal ureter.  I then advanced a 5 [place] access catheter over the sensor wire up to the mid ureter, and the sensor wire was removed.  A retrograde pyelogram was performed which showed no hydronephrosis, and opacified the collecting system.  The wire was replaced in the upper pole.  The rigid cystoscope was backloaded over the wire, and a 6 [place] by 28 cm ureteral stent was uneventfully placed with an excellent curl in the right upper pole, as well as under direct vision the bladder.  Contrast was seen to drain briskly through the side-port of the stent.  The bladder was drained, and this concluded our procedure.  Disposition: Stable to PACU  Plan: Follow-up in 2 to 3 weeks for repeat right ureteroscopy after passive dilation with stent in place Patient must void prior to discharge  Nickolas Madrid, MD

## 2019-04-17 ENCOUNTER — Telehealth: Payer: Self-pay | Admitting: Urology

## 2019-04-17 ENCOUNTER — Encounter: Payer: Self-pay | Admitting: Urology

## 2019-04-17 NOTE — Telephone Encounter (Signed)
Patient notified

## 2019-04-17 NOTE — Telephone Encounter (Signed)
Yes, Amy will call next week to schedule URS in ~2-3 weeks. I sent in percocet to his pharmacy after surgery  Nickolas Madrid, MD 04/17/2019

## 2019-04-17 NOTE — Telephone Encounter (Signed)
-----   Message from Billey Co, MD sent at 04/16/2019  5:33 PM EDT ----- Regarding: cancel stent remova Please cancel his cysto stent removal follow up, thanks  Nickolas Madrid, MD 04/16/2019

## 2019-04-17 NOTE — Telephone Encounter (Signed)
Done

## 2019-04-17 NOTE — Telephone Encounter (Signed)
Pt called and states that he was supposed to hear back from Dr Diamantina Providence concerning the next steps since he was unable to remove stones. He also states that his rx for Oxycodone was not sent to pharmacy.

## 2019-04-20 ENCOUNTER — Other Ambulatory Visit: Payer: Self-pay | Admitting: Radiology

## 2019-04-20 DIAGNOSIS — N200 Calculus of kidney: Secondary | ICD-10-CM

## 2019-04-20 NOTE — Telephone Encounter (Signed)
Patient called and wanted [place] to know that he is urinating  a lot of blood every time he stands up. He is supposed to be coming back for another surgery but said he didn't know when? He would like a call back to discuss the blood.   Sharyn Lull

## 2019-04-20 NOTE — Telephone Encounter (Signed)
Spoke with patient and notified him that this can happen with a stent in place as well as frequency, urgency, and dysuria. He denies fever and chills. Patient was told to utilize oxybutinin. Patient verbalized understanding

## 2019-04-23 ENCOUNTER — Other Ambulatory Visit: Payer: Self-pay

## 2019-04-23 DIAGNOSIS — N200 Calculus of kidney: Secondary | ICD-10-CM

## 2019-04-24 ENCOUNTER — Other Ambulatory Visit: Payer: BC Managed Care – PPO

## 2019-04-27 ENCOUNTER — Other Ambulatory Visit: Payer: Self-pay

## 2019-04-27 ENCOUNTER — Other Ambulatory Visit: Payer: BC Managed Care – PPO | Admitting: Urology

## 2019-04-27 ENCOUNTER — Other Ambulatory Visit: Payer: BC Managed Care – PPO

## 2019-04-27 DIAGNOSIS — N200 Calculus of kidney: Secondary | ICD-10-CM | POA: Diagnosis not present

## 2019-04-28 ENCOUNTER — Telehealth: Payer: Self-pay

## 2019-04-28 ENCOUNTER — Other Ambulatory Visit
Admission: RE | Admit: 2019-04-28 | Discharge: 2019-04-28 | Disposition: A | Discharge status: Home / Self Care | Payer: BC Managed Care – PPO | Source: Ambulatory Visit | Attending: Urology | Admitting: Urology

## 2019-04-28 DIAGNOSIS — Z01812 Encounter for preprocedural laboratory examination: Secondary | ICD-10-CM | POA: Insufficient documentation

## 2019-04-28 DIAGNOSIS — Z20828 Contact with and (suspected) exposure to other viral communicable diseases: Secondary | ICD-10-CM | POA: Insufficient documentation

## 2019-04-28 LAB — URINALYSIS, COMPLETE
Bilirubin, UA: NEGATIVE
Glucose, UA: NEGATIVE
Ketones, UA: NEGATIVE
Nitrite, UA: NEGATIVE
Specific Gravity, UA: >1.03 — ABNORMAL HIGH (ref 1.005–1.030)
Urobilinogen, Ur: 2 mg/dL — ABNORMAL HIGH (ref 0.2–1.0)
pH, UA: 7 (ref 5.0–7.5)

## 2019-04-28 LAB — MICROSCOPIC EXAMINATION: RBC, Urine: >30 /hpf — AB (ref 0–2)

## 2019-04-28 LAB — SARS CORONAVIRUS 2 (TAT 6-24 HRS): SARS Coronavirus 2: NEGATIVE

## 2019-04-28 MED ORDER — SULFAMETHOXAZOLE-TRIMETHOPRIM 800-160 MG PO TABS: 1.0000 | ORAL_TABLET | Freq: Two times a day (BID) | ORAL | 0 refills | Status: DC

## 2019-04-28 NOTE — Telephone Encounter (Signed)
Per Dr. Diamantina Providence called patient to notify him that he would like for him to continue on Bactrim until surgery UA yesterday not totally clear and was sent for culture. Bactrim DS BID 4days was sent to pharmacy Called patient no answer no mailbox set up

## 2019-04-29 DIAGNOSIS — Z01818 Encounter for other preprocedural examination: Secondary | ICD-10-CM | POA: Diagnosis not present

## 2019-04-29 DIAGNOSIS — J9811 Atelectasis: Secondary | ICD-10-CM | POA: Diagnosis not present

## 2019-04-29 DIAGNOSIS — I739 Peripheral vascular disease, unspecified: Secondary | ICD-10-CM | POA: Diagnosis not present

## 2019-04-29 DIAGNOSIS — Z720 Tobacco use: Secondary | ICD-10-CM | POA: Diagnosis not present

## 2019-04-29 DIAGNOSIS — I10 Essential (primary) hypertension: Secondary | ICD-10-CM | POA: Diagnosis not present

## 2019-04-29 DIAGNOSIS — F17218 Nicotine dependence, cigarettes, with other nicotine-induced disorders: Secondary | ICD-10-CM | POA: Diagnosis not present

## 2019-04-29 DIAGNOSIS — E785 Hyperlipidemia, unspecified: Secondary | ICD-10-CM | POA: Diagnosis not present

## 2019-04-29 DIAGNOSIS — Z23 Encounter for immunization: Secondary | ICD-10-CM | POA: Diagnosis not present

## 2019-04-29 LAB — CULTURE, URINE COMPREHENSIVE

## 2019-04-30 ENCOUNTER — Encounter: Payer: Self-pay | Admitting: Urology

## 2019-04-30 MED ORDER — CEFAZOLIN SODIUM-DEXTROSE 2-4 GM/100ML-% IV SOLN
2.0000 g | INTRAVENOUS | Status: AC
  Administered 2019-05-01: 2 g via INTRAVENOUS

## 2019-04-30 NOTE — Telephone Encounter (Signed)
Patient notified

## 2019-04-30 NOTE — Telephone Encounter (Signed)
Yea that's fine, his culture just showed 20K mixed flora despite the suspicious UA  Thanks Nickolas Madrid, MD 04/30/2019

## 2019-05-01 ENCOUNTER — Encounter: Payer: Self-pay | Admitting: *Deleted

## 2019-05-01 ENCOUNTER — Ambulatory Visit: Payer: BC Managed Care – PPO

## 2019-05-01 ENCOUNTER — Encounter
Admission: RE | Disposition: A | Discharge status: Home / Self Care | Payer: Self-pay | Source: Home / Self Care | Attending: Urology

## 2019-05-01 ENCOUNTER — Ambulatory Visit: Payer: BC Managed Care – PPO | Admitting: Anesthesiology

## 2019-05-01 ENCOUNTER — Ambulatory Visit
Admission: RE | Admit: 2019-05-01 | Discharge: 2019-05-01 | Disposition: A | Discharge status: Home / Self Care | Payer: BC Managed Care – PPO | Attending: Urology | Admitting: Urology

## 2019-05-01 ENCOUNTER — Other Ambulatory Visit: Payer: Self-pay

## 2019-05-01 DIAGNOSIS — E1151 Type 2 diabetes mellitus with diabetic peripheral angiopathy without gangrene: Secondary | ICD-10-CM | POA: Diagnosis not present

## 2019-05-01 DIAGNOSIS — M1990 Unspecified osteoarthritis, unspecified site: Secondary | ICD-10-CM | POA: Insufficient documentation

## 2019-05-01 DIAGNOSIS — I10 Essential (primary) hypertension: Secondary | ICD-10-CM | POA: Diagnosis not present

## 2019-05-01 DIAGNOSIS — N200 Calculus of kidney: Secondary | ICD-10-CM

## 2019-05-01 DIAGNOSIS — N201 Calculus of ureter: Secondary | ICD-10-CM | POA: Insufficient documentation

## 2019-05-01 DIAGNOSIS — F17200 Nicotine dependence, unspecified, uncomplicated: Secondary | ICD-10-CM | POA: Diagnosis not present

## 2019-05-01 DIAGNOSIS — I739 Peripheral vascular disease, unspecified: Secondary | ICD-10-CM | POA: Insufficient documentation

## 2019-05-01 DIAGNOSIS — F329 Major depressive disorder, single episode, unspecified: Secondary | ICD-10-CM | POA: Diagnosis not present

## 2019-05-01 DIAGNOSIS — Z466 Encounter for fitting and adjustment of urinary device: Secondary | ICD-10-CM | POA: Diagnosis not present

## 2019-05-01 DIAGNOSIS — F419 Anxiety disorder, unspecified: Secondary | ICD-10-CM | POA: Diagnosis not present

## 2019-05-01 DIAGNOSIS — E118 Type 2 diabetes mellitus with unspecified complications: Secondary | ICD-10-CM | POA: Diagnosis not present

## 2019-05-01 DIAGNOSIS — Z86718 Personal history of other venous thrombosis and embolism: Secondary | ICD-10-CM | POA: Insufficient documentation

## 2019-05-01 HISTORY — PX: CYSTOSCOPY/URETEROSCOPY/HOLMIUM LASER/STENT PLACEMENT: SHX6546

## 2019-05-01 HISTORY — PX: CYSTOSCOPY W/ RETROGRADES: SHX1426

## 2019-05-01 LAB — GLUCOSE, CAPILLARY: Glucose-Capillary: 97 mg/dL (ref 70–99)

## 2019-05-01 SURGERY — CYSTOSCOPY/URETEROSCOPY/HOLMIUM LASER/STENT PLACEMENT
Anesthesia: General | Site: Ureter | Laterality: Right

## 2019-05-01 MED ORDER — OXYCODONE-ACETAMINOPHEN 5-325 MG PO TABS
ORAL_TABLET | ORAL | Status: AC
  Administered 2019-05-01: 2 via ORAL
  Filled 2019-05-01: qty 2

## 2019-05-01 MED ORDER — HYDRALAZINE HCL 20 MG/ML IJ SOLN
INTRAMUSCULAR | Status: DC | PRN
  Administered 2019-05-01: 5 mg via INTRAVENOUS

## 2019-05-01 MED ORDER — CEFAZOLIN SODIUM-DEXTROSE 2-4 GM/100ML-% IV SOLN
INTRAVENOUS | Status: AC
  Filled 2019-05-01: qty 100

## 2019-05-01 MED ORDER — IOHEXOL 180 MG/ML  SOLN
INTRAMUSCULAR | Status: DC | PRN
  Administered 2019-05-01: 20 mL

## 2019-05-01 MED ORDER — PROPOFOL 10 MG/ML IV BOLUS
INTRAVENOUS | Status: DC | PRN
  Administered 2019-05-01: 150 mg via INTRAVENOUS
  Administered 2019-05-01: 50 mg via INTRAVENOUS

## 2019-05-01 MED ORDER — MIDAZOLAM HCL 2 MG/2ML IJ SOLN
INTRAMUSCULAR | Status: DC | PRN
  Administered 2019-05-01: 2 mg via INTRAVENOUS

## 2019-05-01 MED ORDER — OXYCODONE HCL ER 10 MG PO T12A
EXTENDED_RELEASE_TABLET | ORAL | Status: AC
  Administered 2019-05-01: 11:00:00 30 mg via ORAL
  Filled 2019-05-01: qty 3

## 2019-05-01 MED ORDER — SUGAMMADEX SODIUM 200 MG/2ML IV SOLN
INTRAVENOUS | Status: DC | PRN
  Administered 2019-05-01: 194.2 mg via INTRAVENOUS

## 2019-05-01 MED ORDER — FENTANYL CITRATE (PF) 100 MCG/2ML IJ SOLN
INTRAMUSCULAR | Status: AC
  Administered 2019-05-01: 11:00:00 25 ug via INTRAVENOUS
  Filled 2019-05-01: qty 2

## 2019-05-01 MED ORDER — ONDANSETRON HCL 4 MG/2ML IJ SOLN
INTRAMUSCULAR | Status: DC | PRN
  Administered 2019-05-01: 4 mg via INTRAVENOUS

## 2019-05-01 MED ORDER — BELLADONNA ALKALOIDS-OPIUM 16.2-60 MG RE SUPP
RECTAL | Status: AC
  Filled 2019-05-01: qty 1

## 2019-05-01 MED ORDER — LACTATED RINGERS IV SOLN
INTRAVENOUS | Status: DC | PRN
  Administered 2019-05-01: 09:00:00 via INTRAVENOUS

## 2019-05-01 MED ORDER — BELLADONNA ALKALOIDS-OPIUM 16.2-60 MG RE SUPP
RECTAL | Status: DC | PRN
  Administered 2019-05-01: 1 via RECTAL

## 2019-05-01 MED ORDER — FENTANYL CITRATE (PF) 100 MCG/2ML IJ SOLN
INTRAMUSCULAR | Status: DC | PRN
  Administered 2019-05-01: 50 ug via INTRAVENOUS
  Administered 2019-05-01: 100 ug via INTRAVENOUS

## 2019-05-01 MED ORDER — FENTANYL CITRATE (PF) 100 MCG/2ML IJ SOLN
INTRAMUSCULAR | Status: AC
  Filled 2019-05-01: qty 2

## 2019-05-01 MED ORDER — TAMSULOSIN HCL 0.4 MG PO CAPS: 0.4000 mg | ORAL_CAPSULE | Freq: Every day | ORAL | 0 refills | Status: DC

## 2019-05-01 MED ORDER — KETOROLAC TROMETHAMINE 30 MG/ML IJ SOLN
INTRAMUSCULAR | Status: DC | PRN
  Administered 2019-05-01: 15 mg via INTRAVENOUS

## 2019-05-01 MED ORDER — ROCURONIUM BROMIDE 100 MG/10ML IV SOLN
INTRAVENOUS | Status: DC | PRN
  Administered 2019-05-01: 30 mg via INTRAVENOUS

## 2019-05-01 MED ORDER — FAMOTIDINE 20 MG PO TABS
20.0000 mg | ORAL_TABLET | Freq: Once | ORAL | Status: AC
  Administered 2019-05-01: 09:00:00 20 mg via ORAL

## 2019-05-01 MED ORDER — MIDAZOLAM HCL 2 MG/2ML IJ SOLN
INTRAMUSCULAR | Status: AC
  Filled 2019-05-01: qty 2

## 2019-05-01 MED ORDER — FENTANYL CITRATE (PF) 100 MCG/2ML IJ SOLN
25.0000 ug | INTRAMUSCULAR | Status: DC | PRN
  Administered 2019-05-01 (×3): 25 ug via INTRAVENOUS

## 2019-05-01 MED ORDER — PROPOFOL 10 MG/ML IV BOLUS
INTRAVENOUS | Status: AC
  Filled 2019-05-01: qty 20

## 2019-05-01 MED ORDER — ESMOLOL HCL 100 MG/10ML IV SOLN
INTRAVENOUS | Status: DC | PRN
  Administered 2019-05-01: 20 mg via INTRAVENOUS
  Administered 2019-05-01: 30 mg via INTRAVENOUS

## 2019-05-01 MED ORDER — SODIUM CHLORIDE 0.9 % IV SOLN
INTRAVENOUS | Status: DC
  Administered 2019-05-01: 09:00:00 via INTRAVENOUS

## 2019-05-01 MED ORDER — ONDANSETRON HCL 4 MG/2ML IJ SOLN: 4.0000 mg | Freq: Once | INTRAMUSCULAR | Status: DC | PRN

## 2019-05-01 MED ORDER — FAMOTIDINE 20 MG PO TABS
ORAL_TABLET | ORAL | Status: AC
  Administered 2019-05-01: 09:00:00 20 mg via ORAL
  Filled 2019-05-01: qty 1

## 2019-05-01 MED ORDER — SUCCINYLCHOLINE CHLORIDE 20 MG/ML IJ SOLN
INTRAMUSCULAR | Status: DC | PRN
  Administered 2019-05-01: 100 mg via INTRAVENOUS

## 2019-05-01 MED ORDER — OXYCODONE-ACETAMINOPHEN 5-325 MG PO TABS
2.0000 | ORAL_TABLET | Freq: Once | ORAL | Status: AC
  Administered 2019-05-01: 11:00:00 2 via ORAL

## 2019-05-01 MED ORDER — DEXAMETHASONE SODIUM PHOSPHATE 10 MG/ML IJ SOLN
INTRAMUSCULAR | Status: DC | PRN
  Administered 2019-05-01: 10 mg via INTRAVENOUS

## 2019-05-01 MED ORDER — METOPROLOL TARTRATE 5 MG/5ML IV SOLN
INTRAVENOUS | Status: DC | PRN
  Administered 2019-05-01 (×3): 1 mg via INTRAVENOUS

## 2019-05-01 MED ORDER — OXYCODONE HCL ER 15 MG PO T12A
30.0000 mg | EXTENDED_RELEASE_TABLET | Freq: Once | ORAL | Status: AC
  Administered 2019-05-01: 11:00:00 30 mg via ORAL
  Filled 2019-05-01: qty 2

## 2019-05-01 SURGICAL SUPPLY — 31 items
BAG DRAIN CYSTO-URO LG1000N (MISCELLANEOUS) ×3 IMPLANT
BRUSH SCRUB EZ 1% IODOPHOR (MISCELLANEOUS) ×3 IMPLANT
CATH URETL 5X70 OPEN END (CATHETERS) IMPLANT
CNTNR SPEC 2.5X3XGRAD LEK (MISCELLANEOUS)
CONT SPEC 4OZ STER OR WHT (MISCELLANEOUS)
CONTAINER SPEC 2.5X3XGRAD LEK (MISCELLANEOUS) IMPLANT
DRAPE UTILITY 15X26 TOWEL STRL (DRAPES) ×3 IMPLANT
FIBER LASER TRAC TIP (UROLOGICAL SUPPLIES) ×2 IMPLANT
GLOVE BIOGEL PI IND STRL 7.5 (GLOVE) ×1 IMPLANT
GLOVE BIOGEL PI INDICATOR 7.5 (GLOVE) ×2
GOWN STRL REUS W/ TWL LRG LVL3 (GOWN DISPOSABLE) ×1 IMPLANT
GOWN STRL REUS W/ TWL XL LVL3 (GOWN DISPOSABLE) ×1 IMPLANT
GOWN STRL REUS W/TWL LRG LVL3 (GOWN DISPOSABLE) ×2
GOWN STRL REUS W/TWL XL LVL3 (GOWN DISPOSABLE) ×2
GUIDEWIRE STR DUAL SENSOR (WIRE) ×3 IMPLANT
INFUSOR MANOMETER BAG 3000ML (MISCELLANEOUS) ×3 IMPLANT
INTRODUCER DILATOR DOUBLE (INTRODUCER) IMPLANT
KIT TURNOVER CYSTO (KITS) ×3 IMPLANT
PACK CYSTO AR (MISCELLANEOUS) ×3 IMPLANT
SET CYSTO W/LG BORE CLAMP LF (SET/KITS/TRAYS/PACK) ×3 IMPLANT
SHEATH URETERAL 12FR 45CM (SHEATH) ×2 IMPLANT
SHEATH URETERAL 12FRX35CM (MISCELLANEOUS) IMPLANT
SOL .9 NS 3000ML IRR  AL (IV SOLUTION) ×2
SOL .9 NS 3000ML IRR UROMATIC (IV SOLUTION) ×1 IMPLANT
STENT URET 6FRX24 CONTOUR (STENTS) IMPLANT
STENT URET 6FRX26 CONTOUR (STENTS) IMPLANT
STENT URET 6FRX28 CONTOUR (STENTS) ×2 IMPLANT
SURGILUBE 2OZ TUBE FLIPTOP (MISCELLANEOUS) ×3 IMPLANT
SYR 10ML LL (SYRINGE) ×3 IMPLANT
VALVE UROSEAL ADJ ENDO (VALVE) ×2 IMPLANT
WATER STERILE IRR 1000ML POUR (IV SOLUTION) ×3 IMPLANT

## 2019-05-01 NOTE — Anesthesia Procedure Notes (Signed)
Procedure Name: Intubation Date/Time: 05/01/2019 9:46 AM Performed by: Timoteo Expose, CRNA Pre-anesthesia Checklist: Patient identified, Emergency Drugs available, Suction available and Patient being monitored Patient Re-evaluated:Patient Re-evaluated prior to induction Oxygen Delivery Method: Circle system utilized Preoxygenation: Pre-oxygenation with 100% oxygen Induction Type: IV induction Ventilation: Mask ventilation without difficulty Laryngoscope Size: McGraph and 4 Grade View: Grade I Tube type: Oral Tube size: 7.5 mm Number of attempts: 1 Airway Equipment and Method: Stylet and Oral airway Placement Confirmation: ETT inserted through vocal cords under direct vision,  positive ETCO2 and breath sounds checked- equal and bilateral Tube secured with: Tape Dental Injury: Teeth and Oropharynx as per pre-operative assessment

## 2019-05-01 NOTE — Transfer of Care (Signed)
Immediate Anesthesia Transfer of Care Note  Patient: Michael Giles  Procedure(s) Performed: CYSTOSCOPY/URETEROSCOPY/HOLMIUM LASER/STENT Exchange (Right Ureter) CYSTOSCOPY WITH RETROGRADE PYELOGRAM (Right Ureter)  Patient Location: PACU  Anesthesia Type:General  Level of Consciousness: awake  Airway & Oxygen Therapy: Patient Spontanous Breathing  Post-op Assessment: Report given to RN  Post vital signs: Reviewed  Last Vitals:  Vitals Value Taken Time  BP    Temp    Pulse 85 05/01/19 1049  Resp 14 05/01/19 1049  SpO2 95 % 05/01/19 1049  Vitals shown include unvalidated device data.  Last Pain:  Vitals:   05/01/19 0807  TempSrc: Oral  PainSc: 7       Patients Stated Pain Goal: 3 (31/59/45 8592)  Complications: No apparent anesthesia complications

## 2019-05-01 NOTE — Anesthesia Preprocedure Evaluation (Signed)
Anesthesia Evaluation  Patient identified by MRN, date of birth, ID band Patient awake    Reviewed: Allergy & Precautions, NPO status , Patient's Chart, lab work & pertinent test results, reviewed documented beta blocker date and time   Airway Mallampati: II  TM Distance: >3 FB     Dental  (+) Chipped   Pulmonary Current Smoker,           Cardiovascular hypertension, Pt. on medications + Peripheral Vascular Disease and + DVT       Neuro/Psych PSYCHIATRIC DISORDERS Anxiety Depression    GI/Hepatic   Endo/Other  diabetes, Type 2  Renal/GU      Musculoskeletal  (+) Arthritis ,   Abdominal   Peds  Hematology   Anesthesia Other Findings Smokes. EKG ok.  Reproductive/Obstetrics                             Anesthesia Physical Anesthesia Plan  ASA: III  Anesthesia Plan: General   Post-op Pain Management:    Induction: Intravenous  PONV Risk Score and Plan:   Airway Management Planned: Oral ETT  Additional Equipment:   Intra-op Plan:   Post-operative Plan:   Informed Consent: I have reviewed the patients History and Physical, chart, labs and discussed the procedure including the risks, benefits and alternatives for the proposed anesthesia with the patient or authorized representative who has indicated his/her understanding and acceptance.       Plan Discussed with: CRNA  Anesthesia Plan Comments:         Anesthesia Quick Evaluation

## 2019-05-01 NOTE — Op Note (Signed)
Date of procedure: 05/01/19  Preoperative diagnosis:  1. Right proximal ureteral stone, 2 cm  Postoperative diagnosis:  1. Same  Procedure: 1. Cystoscopy, right ureteroscopy, laser lithotripsy, right retrograde pyelogram with intraoperative interpretation, right ureteral stent placement  Surgeon: Nickolas Madrid, MD  Anesthesia: General  Complications: None  Intraoperative findings:  1.  Uncomplicated dusting of large right 2 cm renal pelvis stone 2.  Uncomplicated right ureteral stent placement  EBL: Minimal  Specimens: None  Drains: Right 6 French by 28 cm ureteral stent  Indication: Michael Giles is a 58 y.o. patient with large nearly 2 cm right UPJ stone.  He was pre-stented for inability to access the collecting system with the ureteroscope, and presents today for definitive management.  After reviewing the management options for treatment, they elected to proceed with the above surgical procedure(s). We have discussed the potential benefits and risks of the procedure, side effects of the proposed treatment, the likelihood of the patient achieving the goals of the procedure, and any potential problems that might occur during the procedure or recuperation. Informed consent has been obtained.  Description of procedure:  The patient was taken to the operating room and general anesthesia was induced. SCDs were placed for DVT prophylaxis. The patient was placed in the dorsal lithotomy position, prepped and draped in the usual sterile fashion, and preoperative antibiotics were administered. A preoperative time-out was performed.   A 21 French rigid cystoscope was used to intubate the urethra and a normal appearing urethra was followed proximally into the bladder.  A sensor wire was advanced alongside the right indwelling stent and passed up to the collecting system under fluoroscopic vision.  I then grasped the stent and pulled this to the meatus.  I attempted to wire the stent with an  additional sensor wire, however there was significant encrustation and this would not pass.  The stent was removed, and under direct vision I added a second safety wire into the kidney.  A 12/14 French ureteral access sheath was gently advanced over the wire under fluoroscopic vision up to the level of the proximal ureter.  A single-channel flexible ureteroscope was advanced through the sheath and a large yellow hard appearing stone was visualized in the renal pelvis.  With a 200 m laser fiber on settings of 0.5 J and 20 Hz the stone was methodically dusted.  Thorough inspection of the kidney revealed some additional stones in the lower pole and these were also fragmented to dust.  Thorough inspection at the conclusion showed no residual stones greater than 1 mm in size.   Contrast was injected through the scope for a retrograde pyelogram from the proximal ureter and demonstrated no filling defects or extravasation.  Careful pullback ureteroscopy demonstrated no residual ureteral fragments or ureteral injury and the sheath was removed.  A 6 French by 28 cm right ureteral stent was uneventfully placed under direct vision with a curl in the renal pelvis, as well as under direct vision the bladder.  The bladder was drained and a belladonna suppository was placed.  Disposition: Stable to PACU  Plan: Follow-up for clinic stent removal in ~10 days  Nickolas Madrid, MD

## 2019-05-01 NOTE — Anesthesia Postprocedure Evaluation (Signed)
Anesthesia Post Note  Patient: Michael Giles  Procedure(s) Performed: CYSTOSCOPY/URETEROSCOPY/HOLMIUM LASER/STENT Exchange (Right Ureter) CYSTOSCOPY WITH RETROGRADE PYELOGRAM (Right Ureter)  Patient location during evaluation: PACU Anesthesia Type: General Level of consciousness: awake and alert Pain management: pain level controlled Vital Signs Assessment: post-procedure vital signs reviewed and stable Respiratory status: spontaneous breathing, nonlabored ventilation, respiratory function stable and patient connected to nasal cannula oxygen Cardiovascular status: blood pressure returned to baseline and stable Postop Assessment: no apparent nausea or vomiting Anesthetic complications: no     Last Vitals:  Vitals:   05/01/19 1130 05/01/19 1141  BP: (!) 160/90   Pulse: 73 72  Resp: 13 16  Temp:  36.9 C  SpO2: 99% 100%    Last Pain:  Vitals:   05/01/19 1141  TempSrc: Temporal  PainSc:                  [name] S

## 2019-05-01 NOTE — Anesthesia Post-op Follow-up Note (Signed)
Anesthesia QCDR form completed.        

## 2019-05-01 NOTE — Discharge Instructions (Signed)

## 2019-05-01 NOTE — H&P (Signed)
UROLOGY H&P UPDATE  Agree with prior H&P dated 04/09/19.  Mr. Braley is a 58 year old male with a large 1.5 cm UPJ stone.  He previously underwent attempted ureteroscopy on 04/16/2019 with inability to access the collecting system with the ureteroscope.  He underwent placement of ureteral stent for passive dilation.  He presents today for definitive management.  Cardiac: RRR Lungs: CTA bilaterally  Laterality: Right Procedure: Ureteroscopy, laser lithotripsy, stent placement  Urine: Culture 10/19  10-25K mixed flora  We specifically discussed the risks ureteroscopy including bleeding, infection/sepsis, stent related symptoms including flank pain/urgency/frequency/incontinence/dysuria, ureteral injury, inability to access stone, or need for staged or additional procedures.   Billey Co, MD 05/01/2019

## 2019-05-02 ENCOUNTER — Encounter: Payer: Self-pay | Admitting: Urology

## 2019-05-04 NOTE — Progress Notes (Signed)
Called, no answer.

## 2019-05-07 ENCOUNTER — Encounter: Payer: Self-pay | Admitting: Urology

## 2019-05-07 ENCOUNTER — Other Ambulatory Visit: Payer: Self-pay

## 2019-05-07 ENCOUNTER — Ambulatory Visit (INDEPENDENT_AMBULATORY_CARE_PROVIDER_SITE_OTHER): Payer: BC Managed Care – PPO | Admitting: Urology

## 2019-05-07 VITALS — BP 153/93 | HR 87 | Ht 70.0 in | Wt 206.0 lb

## 2019-05-07 DIAGNOSIS — N200 Calculus of kidney: Secondary | ICD-10-CM

## 2019-05-07 DIAGNOSIS — R300 Dysuria: Secondary | ICD-10-CM

## 2019-05-07 LAB — URINALYSIS, COMPLETE
Bilirubin, UA: NEGATIVE
Glucose, UA: NEGATIVE
Nitrite, UA: NEGATIVE
Specific Gravity, UA: 1.025 (ref 1.005–1.030)
Urobilinogen, Ur: 1 mg/dL (ref 0.2–1.0)
pH, UA: 7 (ref 5.0–7.5)

## 2019-05-07 LAB — MICROSCOPIC EXAMINATION: WBC, UA: >30 /hpf — AB (ref 0–5)

## 2019-05-07 LAB — BLADDER SCAN AMB NON-IMAGING

## 2019-05-07 MED ORDER — OXYBUTYNIN CHLORIDE ER 10 MG PO TB24: 10.0000 mg | ORAL_TABLET | Freq: Every day | ORAL | 0 refills | Status: DC

## 2019-05-07 MED ORDER — CIPROFLOXACIN HCL 250 MG PO TABS: 250.0000 mg | ORAL_TABLET | Freq: Two times a day (BID) | ORAL | 0 refills | Status: DC

## 2019-05-07 NOTE — Progress Notes (Signed)
05/07/2019 11:00 AM   Michael Giles Mar 21, 1961 [id number]  Referring provider: No referring provider defined for this encounter.  Chief Complaint  Patient presents with  . Dysuria    HPI: Michael Giles is a 58 year old male who is status post right URS and ureteral stent placement who presents today with a complaint of worsening burning and stinging with urination.  He underwent a right URS with right ureteral stent placement on 05/01/2019 with Michael Giles for a 2 cm right proximal ureteral stone.    Today, he is complaining of frequency, urgency, burning after urination for 30 to 60 minute, leakage of urine and penile pain.  He stated this started two days ago.  He is taking his tamsulosin, but he has run out of the oxybutynin prescription.  Patient denies any gross hematuria, dysuria or suprapubic/flank pain.  Patient denies any fevers, chills, nausea or vomiting.  His UA today is positive for greater than 30 WBC's, 11-30 RBC's and moderate bacteria.  His PVR is 69 mL.    PMH: Past Medical History:  Diagnosis Date  . Arthritis   . Degeneration of lumbar or lumbosacral intervertebral disc   . Diabetes mellitus without complication (HCC)    diet controlled  . DVT (deep venous thrombosis) (HCC)    H/O UPPER LEFT LEG  . History of angioplasty   . Hyperlipidemia   . Hypertension   . Impotence of organic origin   . Inguinal hernia    age 42 year old  . Opioid type dependence, continuous ([place])   . Other symptoms involving cardiovascular system   . Panic disorder without agoraphobia   . Postlaminectomy syndrome, lumbar region   . Spasm of muscle   . Unspecified vitamin D deficiency     Surgical History: Past Surgical History:  Procedure Laterality Date  . ARTHRODESIS  2003  . BACK SURGERY    . CARDIAC CATHETERIZATION  2010   [facility]/65% blockage left leg no stent  . CATARACT EXTRACTION W/PHACO Right 05/02/2017   Procedure: CATARACT EXTRACTION PHACO AND INTRAOCULAR LENS  PLACEMENT (IOC);  Surgeon: Eulogio Bear, MD;  Location: [facility];  Service: Ophthalmology;  Laterality: Right;  [place] 01:20.0 AP%20.0 CDE16.19 Fluid pack lot # [id number]  . CYSTOSCOPY W/ RETROGRADES Right 05/01/2019   Procedure: CYSTOSCOPY WITH RETROGRADE PYELOGRAM;  Surgeon: Billey Co, MD;  Location: [facility];  Service: Urology;  Laterality: Right;  . CYSTOSCOPY WITH URETEROSCOPY AND STENT PLACEMENT Right 04/16/2019   Procedure: CYSTOSCOPY WITH URETEROSCOPY AND STENT PLACEMENT;  Surgeon: Billey Co, MD;  Location: [facility];  Service: Urology;  Laterality: Right;  . CYSTOSCOPY/URETEROSCOPY/HOLMIUM LASER/STENT PLACEMENT Right 05/01/2019   Procedure: CYSTOSCOPY/URETEROSCOPY/HOLMIUM LASER/STENT Exchange;  Surgeon: Billey Co, MD;  Location: [facility];  Service: Urology;  Laterality: Right;  . INGUINAL HERNIA REPAIR  2003  . LAMINECTOMY      Home Medications:  Allergies as of 05/07/2019      Reactions   Crestor [rosuvastatin]    joint pain      Medication List       Accurate as of May 07, 2019 11:00 AM. If you have any questions, ask your nurse or doctor.        STOP taking these medications   cilostazol 100 MG tablet Commonly known as: PLETAL Stopped by: [name], PA-C   oxyCODONE 30 MG 12 hr tablet Stopped by: [name], PA-C   oxyCODONE-acetaminophen 5-325 MG tablet Commonly known as: Percocet Stopped by: [name], PA-C     TAKE these medications   aspirin 81 MG tablet Take 81 mg by mouth daily.   ciprofloxacin 250 MG tablet Commonly known as: Cipro Take 1 tablet (250 mg total) by mouth 2 (two) times daily. Started by: [name], PA-C   ibuprofen 800 MG tablet Commonly known as: ADVIL Take 1 tablet (800 mg total) by mouth every 8 (eight) hours as needed.   lisinopril 10 MG tablet Commonly known as: ZESTRIL Take 10 mg by mouth daily.   ondansetron 4 MG disintegrating tablet Commonly known as: Zofran ODT Take 1  tablet (4 mg total) by mouth every 8 (eight) hours as needed.   oxybutynin 10 MG 24 hr tablet Commonly known as: DITROPAN-XL Take 1 tablet (10 mg total) by mouth at bedtime. What changed:   how much to take  when to take this Changed by: [name], PA-C   tamsulosin 0.4 MG Caps capsule Commonly known as: FLOMAX Take 1 capsule (0.4 mg total) by mouth daily after supper.       Allergies:  Allergies  Allergen Reactions  . Crestor [Rosuvastatin]     joint pain    Family History: Family History  Problem Relation Age of Onset  . Heart disease Father   . Heart attack Father     Social History:  reports that he has been smoking cigarettes. He has a 48.00 pack-year smoking history. He has never used smokeless tobacco. He reports that he does not drink alcohol or use drugs.  ROS: UROLOGY Frequent Urination?: Yes Hard to postpone urination?: Yes Burning/pain with urination?: Yes Get up at night to urinate?: Yes Leakage of urine?: Yes Urine stream starts and stops?: No Trouble starting stream?: No Do you have to strain to urinate?: No Blood in urine?: No Urinary tract infection?: No Sexually transmitted disease?: No Injury to kidneys or bladder?: No Painful intercourse?: No Weak stream?: No Erection problems?: No Penile pain?: Yes  Gastrointestinal Nausea?: No Vomiting?: No Indigestion/heartburn?: No Diarrhea?: No Constipation?: No  Constitutional Fever: No Night sweats?: No Weight loss?: Yes Fatigue?: No  Skin Skin rash/lesions?: No Itching?: No  Eyes Blurred vision?: No Double vision?: No  Ears/Nose/Throat Sore throat?: No Sinus problems?: No  Hematologic/Lymphatic Swollen glands?: No Easy bruising?: No  Cardiovascular Leg swelling?: No Chest pain?: No  Respiratory Cough?: No Shortness of breath?: No  Endocrine Excessive thirst?: No  Musculoskeletal Back pain?: No Joint pain?: No  Neurological Headaches?: No Dizziness?:  No  Psychologic Depression?: No Anxiety?: No  Physical Exam: BP (!) 153/93   Pulse 87   Ht 5\' 10"  (1.778 m)   Wt 206 lb (93.4 kg)   BMI 29.56 kg/m   Constitutional:  Well nourished. Alert and oriented, No acute distress. HEENT: [place] AT, moist mucus membranes.  Trachea midline, no masses. Cardiovascular: No clubbing, cyanosis, or edema. Respiratory: Normal respiratory effort, no increased work of breathing. Neurologic: Grossly intact, no focal deficits, moving all 4 extremities. Psychiatric: Normal mood and affect.  Laboratory Data: Lab Results  Component Value Date   WBC 13.3 (H) 04/08/2019   HGB 14.9 04/08/2019   HCT 46.0 04/08/2019   MCV 85.3 04/08/2019   PLT 232 04/08/2019    Lab Results  Component Value Date   CREATININE 1.14 04/08/2019    No results found for: PSA  No results found for: TESTOSTERONE  Lab Results  Component Value Date   HGBA1C 5.9 07/30/2016    No results found for: TSH  Component Value Date/Time   CHOL 179 08/06/2016 0919   HDL 43 08/06/2016 0919   CHOLHDL 4.2 08/06/2016 0919   VLDL 12 08/06/2016 0919   LDLCALC 124 (H) 08/06/2016 0919    Lab Results  Component Value Date   AST 17 04/08/2019   Lab Results  Component Value Date   ALT 13 04/08/2019   No components found for: ALKALINEPHOPHATASE No components found for: BILIRUBINTOTAL  No results found for: ESTRADIOL  Urinalysis Component     Latest Ref Rng & Units 05/07/2019  Specific Gravity, UA     1.005 - 1.030 1.025  pH, UA     5.0 - 7.5 7.0  Color, UA     Yellow Yellow  Appearance Ur     Clear Cloudy (A)  Leukocytes,UA     Negative 1+ (A)  Protein,UA     Negative/Trace 2+ (A)  Glucose, UA     Negative Negative  Ketones, UA     Negative Trace (A)  RBC, UA     Negative 2+ (A)  Bilirubin, UA     Negative Negative  Urobilinogen, Ur     0.2 - 1.0 mg/dL 1.0  Nitrite, UA     Negative Negative  Microscopic Examination      See below:   Component      Latest Ref Rng & Units 05/07/2019  WBC, UA     0 - 5 /hpf >30 (A)  RBC     0 - 2 /hpf 11-30 (A)  Epithelial Cells (non renal)     0 - 10 /hpf 0-10  Renal Epithel, UA     None seen /hpf 0-10 (A)  Mucus, UA     Not Estab. Present (A)  Bacteria, UA     None seen/Few Moderate (A)   I have reviewed the labs.   Pertinent Imaging: Results for TIMOUTHY, GILARDI (MRN [id number]) as of 05/07/2019 10:53  Ref. Range 05/07/2019 10:25  Scan Result Unknown 34mL   Assessment & Plan:    1. Dysuria (burning after urination) - BLADDER SCAN AMB NON-IMAGING - CULTURE, URINE COMPREHENSIVE - pending  - Urinalysis, Complete - suspicious for infection - start ciprofloxacin empirically - discussed the rare event of the stent migrating and offered a KUB, but patient had to take his father to a doctor's appointment - he is scheduled for cysto/stent removal on Monday - refilled his oxybutynin prescription today    Return for follow up on Monday for stent removal .  These notes generated with voice recognition software. I apologize for typographical errors.  Michiel Cowboy, PA-C  [facility] [address]  [address] [place], [place] [address] [phone]

## 2019-05-10 LAB — CULTURE, URINE COMPREHENSIVE

## 2019-05-11 ENCOUNTER — Encounter: Payer: Self-pay | Admitting: Urology

## 2019-05-11 ENCOUNTER — Other Ambulatory Visit: Payer: Self-pay

## 2019-05-11 ENCOUNTER — Ambulatory Visit (INDEPENDENT_AMBULATORY_CARE_PROVIDER_SITE_OTHER): Payer: BC Managed Care – PPO | Admitting: Urology

## 2019-05-11 VITALS — BP 141/84 | HR 94 | Ht 70.0 in | Wt 204.0 lb

## 2019-05-11 DIAGNOSIS — N200 Calculus of kidney: Secondary | ICD-10-CM

## 2019-05-11 DIAGNOSIS — R300 Dysuria: Secondary | ICD-10-CM | POA: Diagnosis not present

## 2019-05-11 MED ORDER — TAMSULOSIN HCL 0.4 MG PO CAPS: 0.4000 mg | ORAL_CAPSULE | Freq: Every day | ORAL | 0 refills | Status: DC

## 2019-05-11 NOTE — Patient Instructions (Signed)
Dietary Guidelines to Help Prevent Kidney Stones Kidney stones are deposits of minerals and salts that form inside your kidneys. Your risk of developing kidney stones may be greater depending on your diet, your lifestyle, the medicines you take, and whether you have certain medical conditions. Most people can reduce their chances of developing kidney stones by following the instructions below. Depending on your overall health and the type of kidney stones you tend to develop, your dietitian may give you more specific instructions. What are tips for following this plan? Reading food labels  Choose foods with "no salt added" or "low-salt" labels. Limit your sodium intake to less than 1500 mg per day.  Choose foods with calcium for each meal and snack. Try to eat about 300 mg of calcium at each meal. Foods that contain 200-500 mg of calcium per serving include: ? 8 oz (237 ml) of milk, fortified nondairy milk, and fortified fruit juice. ? 8 oz (237 ml) of kefir, yogurt, and soy yogurt. ? 4 oz (118 ml) of tofu. ? 1 oz of cheese. ? 1 cup (300 g) of dried figs. ? 1 cup (91 g) of cooked broccoli. ? 1-3 oz can of sardines or mackerel.  Most people need 1000 to 1500 mg of calcium each day. Talk to your dietitian about how much calcium is recommended for you. Shopping  Buy plenty of fresh fruits and vegetables. Most people do not need to avoid fruits and vegetables, even if they contain nutrients that may contribute to kidney stones.  When shopping for convenience foods, choose: ? Whole pieces of fruit. ? Premade salads with dressing on the side. ? Low-fat fruit and yogurt smoothies.  Avoid buying frozen meals or prepared deli foods.  Look for foods with live cultures, such as yogurt and kefir. Cooking  Do not add salt to food when cooking. Place a salt shaker on the table and allow each person to add his or her own salt to taste.  Use vegetable protein, such as beans, textured vegetable  protein (TVP), or tofu instead of meat in pasta, casseroles, and soups. Meal planning   Eat less salt, if told by your dietitian. To do this: ? Avoid eating processed or premade food. ? Avoid eating fast food.  Eat less animal protein, including cheese, meat, poultry, or fish, if told by your dietitian. To do this: ? Limit the number of times you have meat, poultry, fish, or cheese each week. Eat a diet free of meat at least 2 days a week. ? Eat only one serving each day of meat, poultry, fish, or seafood. ? When you prepare animal protein, cut pieces into small portion sizes. For most meat and fish, one serving is about the size of one deck of cards.  Eat at least 5 servings of fresh fruits and vegetables each day. To do this: ? Keep fruits and vegetables on hand for snacks. ? Eat 1 piece of fruit or a handful of berries with breakfast. ? Have a salad and fruit at lunch. ? Have two kinds of vegetables at dinner.  Limit foods that are high in a substance called oxalate. These include: ? Spinach. ? Rhubarb. ? Beets. ? Potato chips and french fries. ? Nuts.  If you regularly take a diuretic medicine, make sure to eat at least 1-2 fruits or vegetables high in potassium each day. These include: ? Avocado. ? Banana. ? Orange, prune, carrot, or tomato juice. ? Baked potato. ? Cabbage. ? Beans and split   peas. General instructions   Drink enough fluid to keep your urine clear or pale yellow. This is the most important thing you can do.  Talk to your health care provider and dietitian about taking daily supplements. Depending on your health and the cause of your kidney stones, you may be advised: ? Not to take supplements with vitamin C. ? To take a calcium supplement. ? To take a daily probiotic supplement. ? To take other supplements such as magnesium, fish oil, or vitamin B6.  Take all medicines and supplements as told by your health care provider.  Limit alcohol intake to no  more than 1 drink a day for nonpregnant women and 2 drinks a day for men. One drink equals 12 oz of beer, 5 oz of wine, or 1 oz of hard liquor.  Lose weight if told by your health care provider. Work with your dietitian to find strategies and an eating plan that works best for you. What foods are not recommended? Limit your intake of the following foods, or as told by your dietitian. Talk to your dietitian about specific foods you should avoid based on the type of kidney stones and your overall health. Grains Breads. Bagels. Rolls. Baked goods. Salted crackers. Cereal. Pasta. Vegetables Spinach. Rhubarb. Beets. Canned vegetables. Pickles. Olives. Meats and other protein foods Nuts. Nut butters. Large portions of meat, poultry, or fish. Salted or cured meats. Deli meats. Hot dogs. Sausages. Dairy Cheese. Beverages Regular soft drinks. Regular vegetable juice. Seasonings and other foods Seasoning blends with salt. Salad dressings. Canned soups. Soy sauce. Ketchup. Barbecue sauce. Canned pasta sauce. Casseroles. Pizza. Lasagna. Frozen meals. Potato chips. French fries. Summary  You can reduce your risk of kidney stones by making changes to your diet.  The most important thing you can do is drink enough fluid. You should drink enough fluid to keep your urine clear or pale yellow.  Ask your health care provider or dietitian how much protein from animal sources you should eat each day, and also how much salt and calcium you should have each day. This information is not intended to replace advice given to you by your health care provider. Make sure you discuss any questions you have with your health care provider. Document Released: 10/20/2010 Document Revised: 10/15/2018 Document Reviewed: 06/05/2016 Elsevier Patient Education  2020 Elsevier Inc.  

## 2019-05-11 NOTE — Progress Notes (Signed)
Cystoscopy Procedure Note:  Indication: Stent removal s/p RIGHT URS/LL/stent 05/01/19 for ~2cm UPJ stone  He was treated with culture appropriate Cipro for post-op UTI with 50k enterococcus on 10/29  After informed consent and discussion of the procedure and its risks, Devereaux F Alix was positioned and prepped in the standard fashion. Cystoscopy was performed with a flexible cystoscope. The stent was grasped with flexible graspers and removed in its entirety. The patient tolerated the procedure well.  Findings: Uncomplicated stent removal  Assessment and Plan: Follow up in 4 weeks with renal ultrasound to evaluate for silent hydronephrosis  We discussed general stone prevention strategies including adequate hydration with goal of producing 2.5 L of urine daily, increasing citric acid intake, increasing calcium intake during high oxalate meals, minimizing animal protein, and decreasing salt intake. Information about dietary recommendations given today.    Billey Co, MD 05/11/2019

## 2019-05-12 LAB — MICROSCOPIC EXAMINATION: RBC, Urine: >30 /hpf — AB (ref 0–2)

## 2019-05-12 LAB — URINALYSIS, COMPLETE
Bilirubin, UA: NEGATIVE
Glucose, UA: NEGATIVE
Nitrite, UA: NEGATIVE
Specific Gravity, UA: 1.025 (ref 1.005–1.030)
Urobilinogen, Ur: 0.2 mg/dL (ref 0.2–1.0)
pH, UA: 5 (ref 5.0–7.5)

## 2019-06-08 NOTE — Progress Notes (Deleted)
06/09/2019 2:47 PM   Link F Mcneill Apr 27, 1961 [id number]  Referring provider: No referring provider defined for this encounter.  No chief complaint on file.   HPI: Mr. Groleau is a 58 year old male who is status post right URS and ureteral stent placement for a 2 cm right proximal ureteral stone who presents today to review his RUS.  Underwent a right URS with right ureteral stent placement on 05/01/2019 with Dr. Richardo Hanks for a 2 cm right proximal ureteral stone.     Today, he is complaining of frequency, urgency, burning after urination for 30 to 60 minute, leakage of urine and penile pain.  He stated this started two days ago.  He is taking his tamsulosin, but he has run out of the oxybutynin prescription.  Patient denies any gross hematuria, dysuria or suprapubic/flank pain.  Patient denies any fevers, chills, nausea or vomiting.  His UA today is positive for greater than 30 WBC's, 11-30 RBC's and moderate bacteria.  His PVR is 69 mL.    PMH: Past Medical History:  Diagnosis Date  . Arthritis   . Degeneration of lumbar or lumbosacral intervertebral disc   . Diabetes mellitus without complication (HCC)    diet controlled  . DVT (deep venous thrombosis) (HCC)    H/O UPPER LEFT LEG  . History of angioplasty   . Hyperlipidemia   . Hypertension   . Impotence of organic origin   . Inguinal hernia    age 37 year old  . Opioid type dependence, continuous (HCC)   . Other symptoms involving cardiovascular system   . Panic disorder without agoraphobia   . Postlaminectomy syndrome, lumbar region   . Spasm of muscle   . Unspecified vitamin D deficiency     Surgical History: Past Surgical History:  Procedure Laterality Date  . ARTHRODESIS  2003  . BACK SURGERY    . CARDIAC CATHETERIZATION  2010   [facility]/65% blockage left leg no stent  . CATARACT EXTRACTION W/PHACO Right 05/02/2017   Procedure: CATARACT EXTRACTION PHACO AND INTRAOCULAR LENS PLACEMENT (IOC);  Surgeon: Nevada Crane, MD;  Location: [facility];  Service: Ophthalmology;  Laterality: Right;  [place] 01:20.0 AP%20.0 CDE16.19 Fluid pack lot # [id number]  . CYSTOSCOPY W/ RETROGRADES Right 05/01/2019   Procedure: CYSTOSCOPY WITH RETROGRADE PYELOGRAM;  Surgeon: Sondra Come, MD;  Location: [facility];  Service: Urology;  Laterality: Right;  . CYSTOSCOPY WITH URETEROSCOPY AND STENT PLACEMENT Right 04/16/2019   Procedure: CYSTOSCOPY WITH URETEROSCOPY AND STENT PLACEMENT;  Surgeon: Sondra Come, MD;  Location: [facility];  Service: Urology;  Laterality: Right;  . CYSTOSCOPY/URETEROSCOPY/HOLMIUM LASER/STENT PLACEMENT Right 05/01/2019   Procedure: CYSTOSCOPY/URETEROSCOPY/HOLMIUM LASER/STENT Exchange;  Surgeon: Sondra Come, MD;  Location: [facility];  Service: Urology;  Laterality: Right;  . INGUINAL HERNIA REPAIR  2003  . LAMINECTOMY      Home Medications:  Allergies as of 06/09/2019      Reactions   Crestor [rosuvastatin]    joint pain      Medication List       Accurate as of June 08, 2019  2:47 PM. If you have any questions, ask your nurse or doctor.        aspirin 81 MG tablet Take 81 mg by mouth daily.   ciprofloxacin 250 MG tablet Commonly known as: Cipro Take 1 tablet (250 mg total) by mouth 2 (two) times daily.   ibuprofen 800 MG tablet Commonly known as: ADVIL Take 1 tablet (800 mg  total) by mouth every 8 (eight) hours as needed.   lisinopril 10 MG tablet Commonly known as: ZESTRIL Take 10 mg by mouth daily.   ondansetron 4 MG disintegrating tablet Commonly known as: Zofran ODT Take 1 tablet (4 mg total) by mouth every 8 (eight) hours as needed.   oxybutynin 10 MG 24 hr tablet Commonly known as: DITROPAN-XL Take 1 tablet (10 mg total) by mouth at bedtime.   tamsulosin 0.4 MG Caps capsule Commonly known as: FLOMAX Take 1 capsule (0.4 mg total) by mouth daily after supper.       Allergies:  Allergies  Allergen Reactions  . Crestor [Rosuvastatin]     joint pain     Family History: Family History  Problem Relation Age of Onset  . Heart disease Father   . Heart attack Father     Social History:  reports that he has been smoking cigarettes. He has a 48.00 pack-year smoking history. He has never used smokeless tobacco. He reports that he does not drink alcohol or use drugs.  ROS:                                        Physical Exam: There were no vitals taken for this visit.  Constitutional:  Well nourished. Alert and oriented, No acute distress. HEENT: [place] AT, moist mucus membranes.  Trachea midline, no masses. Cardiovascular: No clubbing, cyanosis, or edema. Respiratory: Normal respiratory effort, no increased work of breathing. Neurologic: Grossly intact, no focal deficits, moving all 4 extremities. Psychiatric: Normal mood and affect.  Laboratory Data: Lab Results  Component Value Date   WBC 13.3 (H) 04/08/2019   HGB 14.9 04/08/2019   HCT 46.0 04/08/2019   MCV 85.3 04/08/2019   PLT 232 04/08/2019    Lab Results  Component Value Date   CREATININE 1.14 04/08/2019    No results found for: PSA  No results found for: TESTOSTERONE  Lab Results  Component Value Date   HGBA1C 5.9 07/30/2016    No results found for: TSH     Component Value Date/Time   CHOL 179 08/06/2016 0919   HDL 43 08/06/2016 0919   CHOLHDL 4.2 08/06/2016 0919   VLDL 12 08/06/2016 0919   LDLCALC 124 (H) 08/06/2016 0919    Lab Results  Component Value Date   AST 17 04/08/2019   Lab Results  Component Value Date   ALT 13 04/08/2019   No components found for: ALKALINEPHOPHATASE No components found for: BILIRUBINTOTAL  No results found for: ESTRADIOL  Urinalysis Component     Latest Ref Rng & Units 05/07/2019  Specific Gravity, UA     1.005 - 1.030 1.025  pH, UA     5.0 - 7.5 7.0  Color, UA     Yellow Yellow  Appearance Ur     Clear Cloudy (A)  Leukocytes,UA     Negative 1+ (A)  Protein,UA     Negative/Trace  2+ (A)  Glucose, UA     Negative Negative  Ketones, UA     Negative Trace (A)  RBC, UA     Negative 2+ (A)  Bilirubin, UA     Negative Negative  Urobilinogen, Ur     0.2 - 1.0 mg/dL 1.0  Nitrite, UA     Negative Negative  Microscopic Examination      See below:   Component     Latest  Ref Rng & Units 05/07/2019  WBC, UA     0 - 5 /hpf >30 (A)  RBC     0 - 2 /hpf 11-30 (A)  Epithelial Cells (non renal)     0 - 10 /hpf 0-10  Renal Epithel, UA     None seen /hpf 0-10 (A)  Mucus, UA     Not Estab. Present (A)  Bacteria, UA     None seen/Few Moderate (A)   I have reviewed the labs.   Pertinent Imaging: Results for Armando ReichertHORNER, Alphus F (MRN [id number]) as of 05/07/2019 10:53  Ref. Range 05/07/2019 10:25  Scan Result Unknown 69mL   Assessment & Plan:    1. Dysuria (burning after urination) - BLADDER SCAN AMB NON-IMAGING - CULTURE, URINE COMPREHENSIVE - pending  - Urinalysis, Complete - suspicious for infection - start ciprofloxacin empirically - discussed the rare event of the stent migrating and offered a KUB, but patient had to take his father to a doctor's appointment - he is scheduled for cysto/stent removal on Monday - refilled his oxybutynin prescription today    No follow-ups on file.  These notes generated with voice recognition software. I apologize for typographical errors.  Michiel CowboySHANNON Lorann Tani, PA-C  [facility] [address]  [address] [place], [place] [id number] [phone]

## 2019-06-09 ENCOUNTER — Ambulatory Visit: Payer: BC Managed Care – PPO | Admitting: Urology

## 2019-06-17 ENCOUNTER — Ambulatory Visit: Payer: BC Managed Care – PPO

## 2019-07-06 ENCOUNTER — Ambulatory Visit: Payer: BC Managed Care – PPO

## 2019-07-06 NOTE — Progress Notes (Deleted)
07/07/2019 7:55 PM   Michael Giles 02-13-1961 [id number]  Referring provider: No referring provider defined for this encounter.  No chief complaint on file.   HPI: Michael Giles is a 58 year old male who is status post right URS and ureteral stent placement for a 2 cm right proximal ureteral stone who presents today to review his RUS.  Underwent a right URS with right ureteral stent placement on 05/01/2019 with Dr. Diamantina Giles for a 2 cm right proximal ureteral stone.   Had stent removed 05/07/2019.    RUS     PMH: Past Medical History:  Diagnosis Date  . Arthritis   . Degeneration of lumbar or lumbosacral intervertebral disc   . Diabetes mellitus without complication (HCC)    diet controlled  . DVT (deep venous thrombosis) (HCC)    H/O UPPER LEFT LEG  . History of angioplasty   . Hyperlipidemia   . Hypertension   . Impotence of organic origin   . Inguinal hernia    age 54 year old  . Opioid type dependence, continuous ([place])   . Other symptoms involving cardiovascular system   . Panic disorder without agoraphobia   . Postlaminectomy syndrome, lumbar region   . Spasm of muscle   . Unspecified vitamin D deficiency     Surgical History: Past Surgical History:  Procedure Laterality Date  . ARTHRODESIS  2003  . BACK SURGERY    . CARDIAC CATHETERIZATION  2010   [facility]/65% blockage left leg no stent  . CATARACT EXTRACTION W/PHACO Right 05/02/2017   Procedure: CATARACT EXTRACTION PHACO AND INTRAOCULAR LENS PLACEMENT (IOC);  Surgeon: Michael Bear, MD;  Location: [facility];  Service: Ophthalmology;  Laterality: Right;  [place] 01:20.0 AP%20.0 CDE16.19 Fluid pack lot # [id number]  . CYSTOSCOPY W/ RETROGRADES Right 05/01/2019   Procedure: CYSTOSCOPY WITH RETROGRADE PYELOGRAM;  Surgeon: Michael Co, MD;  Location: [facility];  Service: Urology;  Laterality: Right;  . CYSTOSCOPY WITH URETEROSCOPY AND STENT PLACEMENT Right 04/16/2019   Procedure: CYSTOSCOPY WITH URETEROSCOPY  AND STENT PLACEMENT;  Surgeon: Michael Co, MD;  Location: [facility];  Service: Urology;  Laterality: Right;  . CYSTOSCOPY/URETEROSCOPY/HOLMIUM LASER/STENT PLACEMENT Right 05/01/2019   Procedure: CYSTOSCOPY/URETEROSCOPY/HOLMIUM LASER/STENT Exchange;  Surgeon: Michael Co, MD;  Location: [facility];  Service: Urology;  Laterality: Right;  . INGUINAL HERNIA REPAIR  2003  . LAMINECTOMY      Home Medications:  Allergies as of 07/07/2019      Reactions   Crestor [rosuvastatin]    joint pain      Medication List       Accurate as of July 06, 2019  7:55 PM. If you have any questions, ask your nurse or doctor.        aspirin 81 MG tablet Take 81 mg by mouth daily.   ciprofloxacin 250 MG tablet Commonly known as: Cipro Take 1 tablet (250 mg total) by mouth 2 (two) times daily.   ibuprofen 800 MG tablet Commonly known as: ADVIL Take 1 tablet (800 mg total) by mouth every 8 (eight) hours as needed.   lisinopril 10 MG tablet Commonly known as: ZESTRIL Take 10 mg by mouth daily.   ondansetron 4 MG disintegrating tablet Commonly known as: Zofran ODT Take 1 tablet (4 mg total) by mouth every 8 (eight) hours as needed.   oxybutynin 10 MG 24 hr tablet Commonly known as: DITROPAN-XL Take 1 tablet (10 mg total) by mouth at bedtime.   tamsulosin 0.4 MG  Caps capsule Commonly known as: FLOMAX Take 1 capsule (0.4 mg total) by mouth daily after supper.       Allergies:  Allergies  Allergen Reactions  . Crestor [Rosuvastatin]     joint pain    Family History: Family History  Problem Relation Age of Onset  . Heart disease Father   . Heart attack Father     Social History:  reports that he has been smoking cigarettes. He has a 48.00 pack-year smoking history. He has never used smokeless tobacco. He reports that he does not drink alcohol or use drugs.  ROS:                                        Physical Exam: There were no vitals taken  for this visit.  Constitutional:  Well nourished. Alert and oriented, No acute distress. HEENT: [place] AT, moist mucus membranes.  Trachea midline, no masses. Cardiovascular: No clubbing, cyanosis, or edema. Respiratory: Normal respiratory effort, no increased work of breathing. Neurologic: Grossly intact, no focal deficits, moving all 4 extremities. Psychiatric: Normal mood and affect.  Laboratory Data: Lab Results  Component Value Date   WBC 13.3 (H) 04/08/2019   HGB 14.9 04/08/2019   HCT 46.0 04/08/2019   MCV 85.3 04/08/2019   PLT 232 04/08/2019    Lab Results  Component Value Date   CREATININE 1.14 04/08/2019    No results found for: PSA  No results found for: TESTOSTERONE  Lab Results  Component Value Date   HGBA1C 5.9 07/30/2016    No results found for: TSH     Component Value Date/Time   CHOL 179 08/06/2016 0919   HDL 43 08/06/2016 0919   CHOLHDL 4.2 08/06/2016 0919   VLDL 12 08/06/2016 0919   LDLCALC 124 (H) 08/06/2016 0919    Lab Results  Component Value Date   AST 17 04/08/2019   Lab Results  Component Value Date   ALT 13 04/08/2019   No components found for: ALKALINEPHOPHATASE No components found for: BILIRUBINTOTAL  No results found for: ESTRADIOL  Urinalysis Component     Latest Ref Rng & Units 05/07/2019  Specific Gravity, UA     1.005 - 1.030 1.025  pH, UA     5.0 - 7.5 7.0  Color, UA     Yellow Yellow  Appearance Ur     Clear Cloudy (A)  Leukocytes,UA     Negative 1+ (A)  Protein,UA     Negative/Trace 2+ (A)  Glucose, UA     Negative Negative  Ketones, UA     Negative Trace (A)  RBC, UA     Negative 2+ (A)  Bilirubin, UA     Negative Negative  Urobilinogen, Ur     0.2 - 1.0 mg/dL 1.0  Nitrite, UA     Negative Negative  Microscopic Examination      See below:   Component     Latest Ref Rng & Units 05/07/2019  WBC, UA     0 - 5 /hpf >30 (A)  RBC     0 - 2 /hpf 11-30 (A)  Epithelial Cells (non renal)     0 - 10 /hpf  0-10  Renal Epithel, UA     None seen /hpf 0-10 (A)  Mucus, UA     Not Estab. Present (A)  Bacteria, UA     None seen/Few Moderate (  A)   I have reviewed the labs.   Pertinent Imaging: Results for Michael Giles (MRN [id number]) as of 05/07/2019 10:53  Ref. Range 05/07/2019 10:25  Scan Result Unknown 61mL   Assessment & Plan:    1. Dysuria (burning after urination) - BLADDER SCAN AMB NON-IMAGING - CULTURE, URINE COMPREHENSIVE - pending  - Urinalysis, Complete - suspicious for infection - start ciprofloxacin empirically - discussed the rare event of the stent migrating and offered a KUB, but patient had to take his father to a doctor's appointment - he is scheduled for cysto/stent removal on Monday - refilled his oxybutynin prescription today    No follow-ups on file.  These notes generated with voice recognition software. I apologize for typographical errors.  Michiel Cowboy, PA-C  [facility] [address]  [address] [place], [place] [address] [phone]

## 2019-07-07 ENCOUNTER — Ambulatory Visit: Payer: BC Managed Care – PPO | Admitting: Urology

## 2019-10-10 ENCOUNTER — Telehealth: Payer: Self-pay | Admitting: Urology

## 2019-10-10 NOTE — Telephone Encounter (Signed)
Michael Giles has not yet had his follow up RUS to evaluate for silent hydronephrosis from his URS in 04/2019.  Would you call him and get that rescheduled?

## 2019-10-12 NOTE — Telephone Encounter (Signed)
Unable to reach patient by phone, no voicemail set up. LMOM for daughters, Lequita Halt and [place], to have patient return call.

## 2020-01-25 ENCOUNTER — Encounter: Payer: Self-pay | Admitting: Urology

## 2020-03-27 IMAGING — CT CT RENAL STONE PROTOCOL
2 of 4 series · 15 of 46 positions shown, 17 images · non-contrast
Comparison: Lumbar radiographs 03/27/2014

CLINICAL DATA: Flank pain, stone disease suspected

EXAM:
CT ABDOMEN AND PELVIS WITHOUT CONTRAST
TECHNIQUE: Multidetector CT imaging of the abdomen and pelvis was performed
following the standard protocol without IV contrast.

[Series 2: stone full standard · axial · 0.80mm/px · z∈[-847,-402]mm · 12 of 99 slices shown, 14 images]
[im 5/99  soft-tissue]
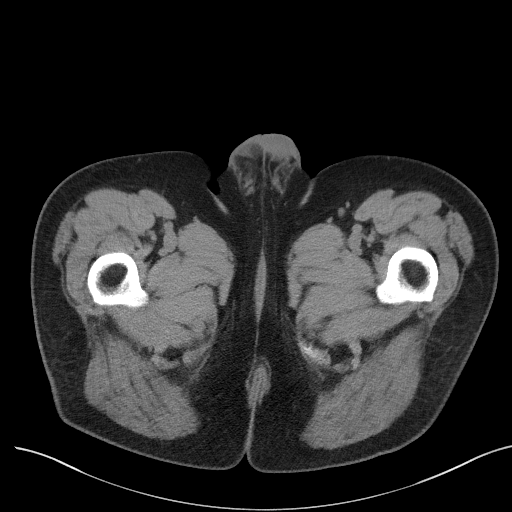
[im 5/99  bone]
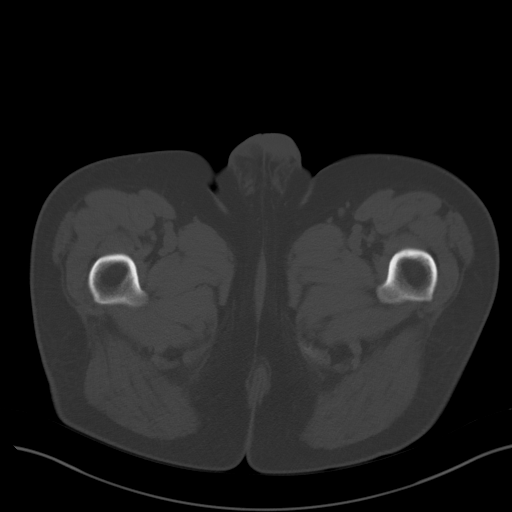
[im 13/99  soft-tissue]
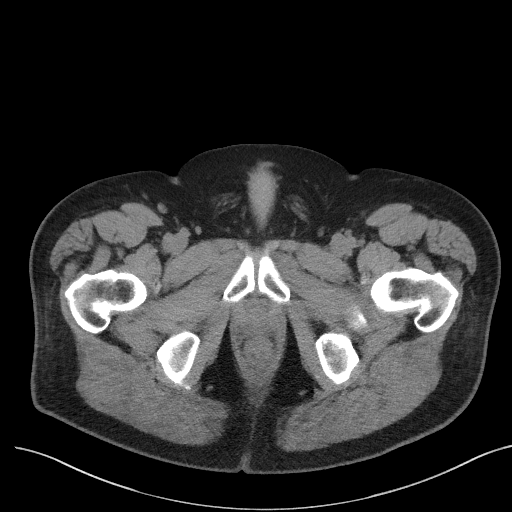
[im 22/99  soft-tissue]
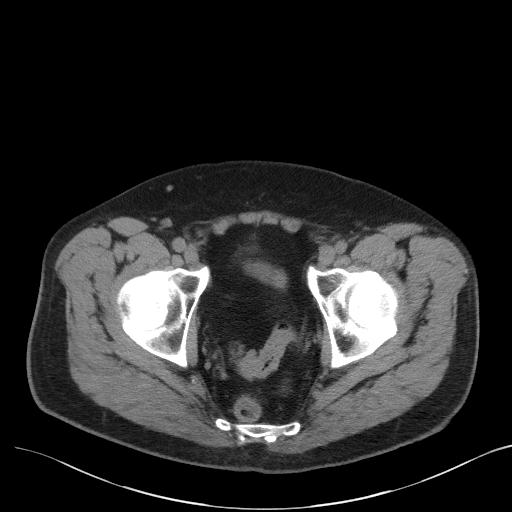
[im 30/99  soft-tissue]
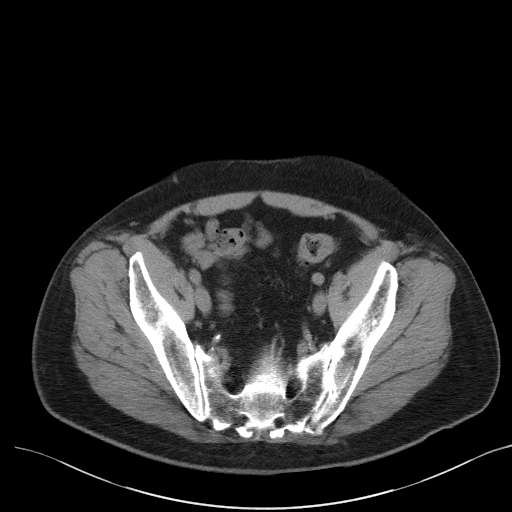
[im 39/99  soft-tissue]
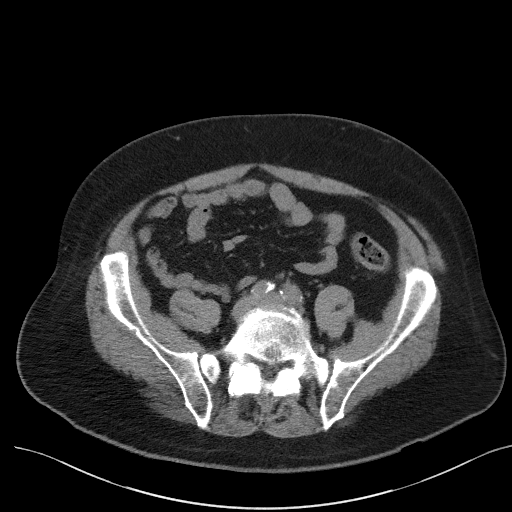
[im 47/99  soft-tissue]
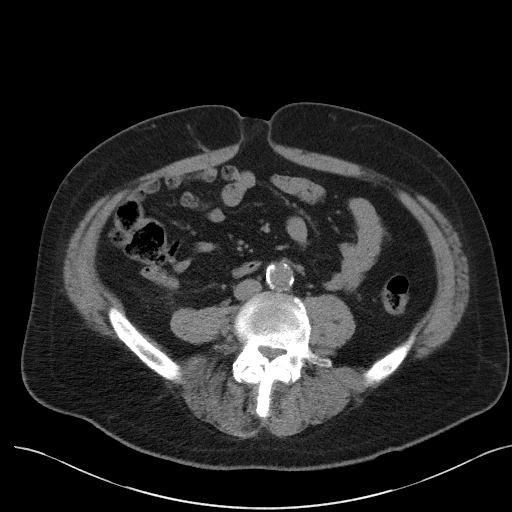
[im 52/99  soft-tissue]
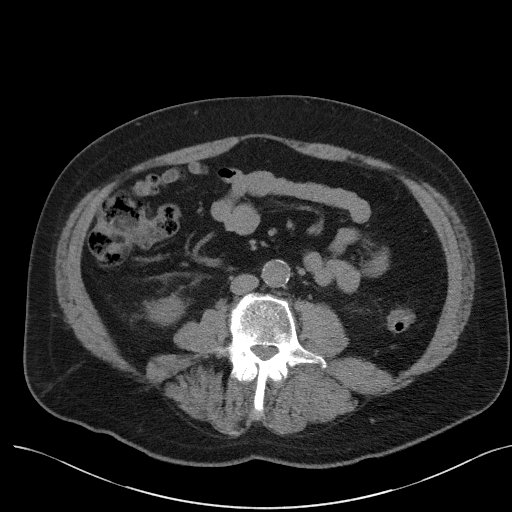
[im 60/99  soft-tissue]
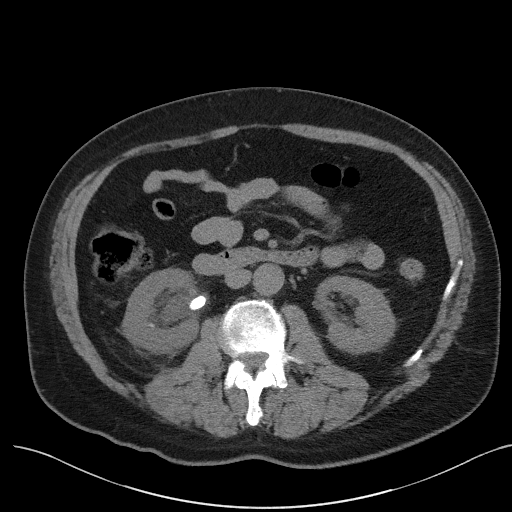
[im 69/99  soft-tissue]
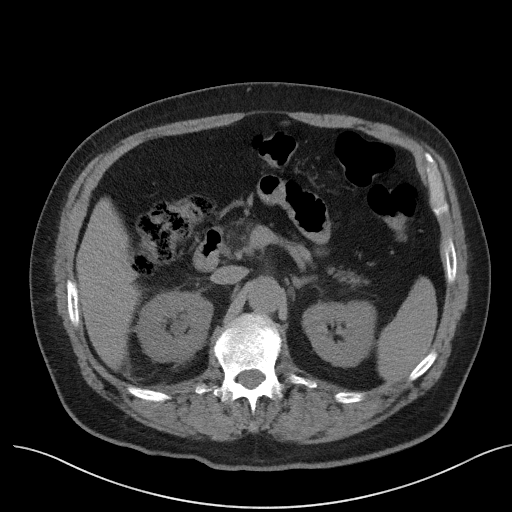
[im 69/99  bone]
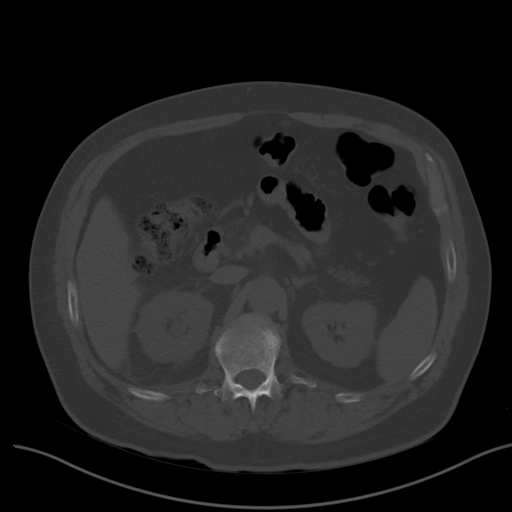
[im 77/99  soft-tissue]
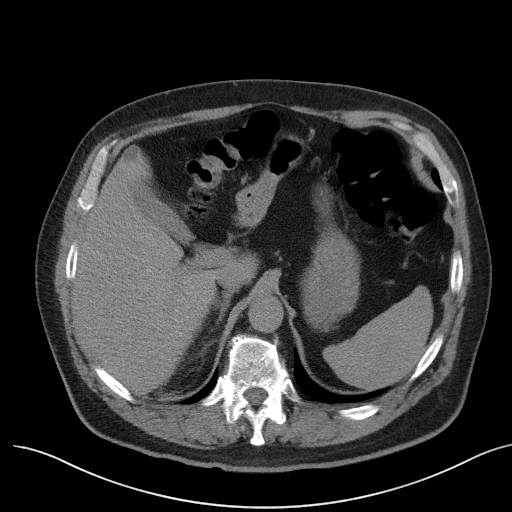
[im 86/99  soft-tissue]
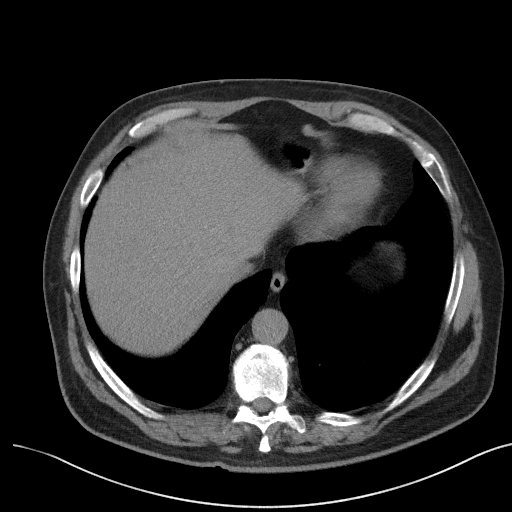
[im 94/99  soft-tissue]
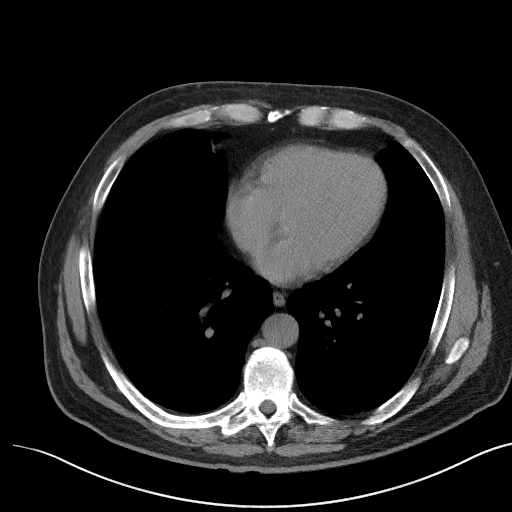

[Series 5: coronal · coronal · 0.81mm/px · 3 of 142 slices shown]
[im 48/142  soft-tissue]
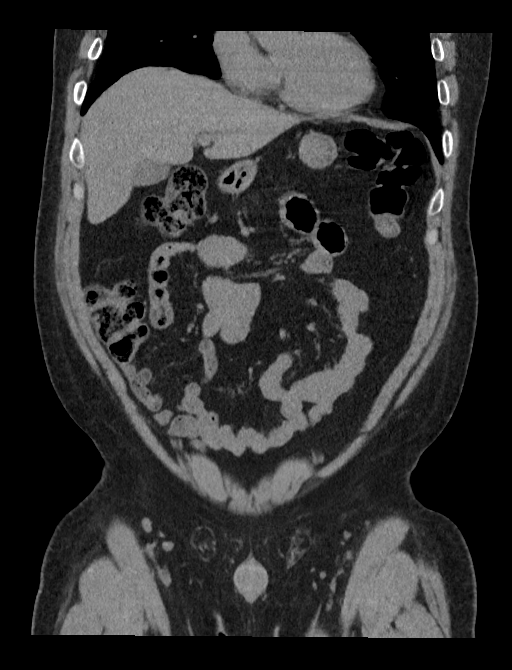
[im 63/142  soft-tissue]
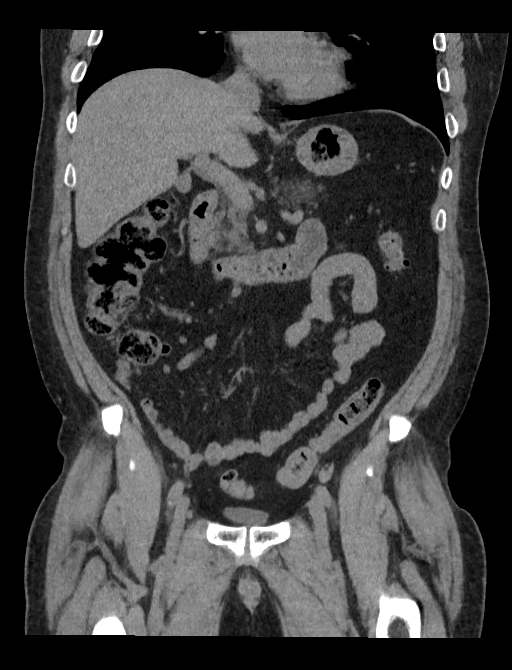
[im 79/142  soft-tissue]
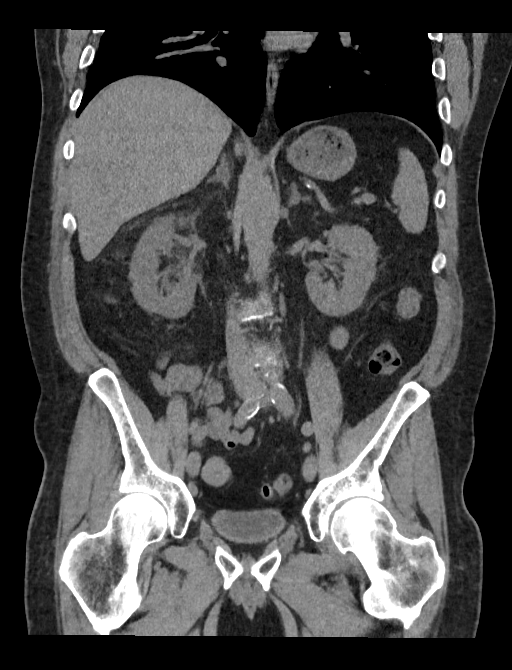

[15 of 46 positions shown; findings below may reference images not displayed]

FINDINGS: Lower chest: Bandlike area of atelectasis in the left lung base.
Abundant mediastinal fat is noted. Normal heart size. No pericardial
effusion.

Hepatobiliary: No focal liver abnormality is seen. Phrygian cap
morphology of the gallbladder, benign incidental finding. No
gallstones, gallbladder wall thickening, or biliary dilatation.

Pancreas: Fatty replacement of the pancreas. No pancreatic ductal
dilatation or surrounding inflammatory changes.

Spleen: Normal in size without focal abnormality.

Adrenals/Urinary Tract: Normal adrenal glands.

There is moderate right hydronephrosis with a 9 by 19 mm ovoid
calculus positioned at the right ureteropelvic junction. There is
asymmetrically increased right perinephric stranding and additional
nonobstructing calculi in the interpolar and lower pole right kidney
several of which larger measuring up to 11 mm in size. No visible or
contour deforming renal lesions. No left urolithiasis. Mild left
perinephric stranding is nonspecific. Mild circumferential bladder
wall thickening, may be related to underdistention.

Stomach/Bowel: Distal esophagus, stomach and duodenal sweep are
unremarkable. No bowel wall thickening or dilatation. No evidence of
obstruction. A normal appendix is visualized. Proximal colon is
unremarkable. There is a segment of circumferentially thickened
distal sigmoid colon in a region of multiple colonic diverticula
without acute pericolonic inflammation. No extraluminal gas or
abscess formation.

Vascular/Lymphatic: Atherosclerotic plaque within the normal caliber
aorta. Luminal evaluation limited in the absence of contrast. No
suspicious or enlarged lymph nodes in the included lymphatic chains.

Reproductive: Prostate at the upper limits of normal for size.

Other: No abdominopelvic free fluid or free gas. No bowel containing
hernias. Fat containing umbilical hernia small bilateral fat
containing inguinal hernias.

Musculoskeletal: S-shaped scoliotic curvature of the thoracolumbar
spine with multilevel discogenic and facet degenerative changes.
Features are maximal at L4-5. No acute or suspicious osseous
lesions.
IMPRESSION: 1. Moderate right hydronephrosis and asymmetric right perinephric
stranding with a 9 x 19 mm ovoid calculus positioned at the right
ureteropelvic junction.
2. Additional nonobstructing calculi in the interpolar and lower
pole right kidney.
3. Short segment of circumferentially thickened distal sigmoid colon
in a region of multiple colonic diverticula without acute
pericolonic inflammation, favored to reflect sequela of prior
diverticulitis. However, recommend correlation with colonoscopy to
exclude underlying mass.
4. Aortic Atherosclerosis ([id number]-[id number].R).
5. Scoliotic curvature of the spine and multilevel degenerative
changes, maximal L4-5.

## 2024-02-17 ENCOUNTER — Other Ambulatory Visit (INDEPENDENT_AMBULATORY_CARE_PROVIDER_SITE_OTHER): Payer: Self-pay | Admitting: Vascular Surgery

## 2024-02-17 DIAGNOSIS — I739 Peripheral vascular disease, unspecified: Secondary | ICD-10-CM

## 2024-02-18 ENCOUNTER — Ambulatory Visit (INDEPENDENT_AMBULATORY_CARE_PROVIDER_SITE_OTHER): Payer: Self-pay | Admitting: Vascular Surgery

## 2024-02-18 ENCOUNTER — Encounter (INDEPENDENT_AMBULATORY_CARE_PROVIDER_SITE_OTHER): Payer: Self-pay | Admitting: Vascular Surgery

## 2024-02-18 ENCOUNTER — Other Ambulatory Visit (INDEPENDENT_AMBULATORY_CARE_PROVIDER_SITE_OTHER): Payer: Self-pay

## 2024-02-18 VITALS — BP 146/82 | HR 74 | Ht 70.0 in | Wt 242.0 lb

## 2024-02-18 DIAGNOSIS — E782 Mixed hyperlipidemia: Secondary | ICD-10-CM | POA: Diagnosis not present

## 2024-02-18 DIAGNOSIS — I10 Essential (primary) hypertension: Secondary | ICD-10-CM

## 2024-02-18 DIAGNOSIS — I739 Peripheral vascular disease, unspecified: Secondary | ICD-10-CM | POA: Diagnosis not present

## 2024-02-18 DIAGNOSIS — I70222 Atherosclerosis of native arteries of extremities with rest pain, left leg: Secondary | ICD-10-CM | POA: Diagnosis not present

## 2024-02-18 NOTE — H&P (View-Only) (Signed)
 Subjective:    Patient ID: Michael Giles, male    DOB: 11-19-1960, 63 y.o.   MRN: [id number] Chief Complaint  Patient presents with   New Patient (Initial Visit)    np. ABI + consult. PVD.     Michael Giles is a 63 yo male who presents to clinic today with chief complaint of left lower extremity leg pain.  Patient states his leg pain started back in 2014.  He was seen at [facility] at that time and underwent left lower extremity angiogram.  The results showed that he had a blockage in his left upper groin area which he is unable to exactly name.  He states that at the time they told him they were unable to place a stent due to blood supply that would have been blocked off if the stent was in place.  At that time they placed him on Pletal  100 mg twice daily.  He had been on this since 2014 and has not had any leg issues.  He was taken off of it last year by his primary care in 2024.  His left foot and leg pain then returned.  He was unable to get any Pletal  so he started using aspirin.  He was taking 8-10 baby aspirin 3-4 times a day.  He was also placed back on Pletal  after this but he states that it does not help and therefore was using the aspirin.  Patient states that his left foot gets sharp shooting and stinging pains.  He states he sleeps sitting up as he cannot lay down because if he does the pain is intense.  He is on oxycodone  already daily for prior back pains and he states that that does nothing for his pain today.  He presents today for evaluation.  He underwent vascular ultrasounds with ABIs today.  Right ABI is 1.04 with triphasic pulses.  No prior ABIs listed. Left ABI is 0.83 with monophasic pulses.  No prior ABIs listed.    Review of Systems  Constitutional: Negative.   Musculoskeletal:  Positive for arthralgias, back pain and gait problem.  Skin:  Positive for color change.  All other systems reviewed and are negative.      Objective:   Physical Exam Constitutional:       Appearance: Normal appearance. He is obese.  HENT:     Head: Normocephalic.  Eyes:     Pupils: Pupils are equal, round, and reactive to light.  Cardiovascular:     Rate and Rhythm: Normal rate and regular rhythm.     Pulses: Normal pulses.     Heart sounds: Normal heart sounds.  Pulmonary:     Effort: Pulmonary effort is normal.     Breath sounds: Normal breath sounds.  Abdominal:     General: Bowel sounds are normal.     Palpations: Abdomen is soft.  Musculoskeletal:        General: Tenderness present.  Skin:    General: Skin is warm and dry.     Capillary Refill: Capillary refill takes 2 to 3 seconds.  Neurological:     General: No focal deficit present.     Mental Status: He is alert and oriented to person, place, and time. Mental status is at baseline.     Gait: Gait abnormal.  Psychiatric:        Mood and Affect: Mood normal.        Thought Content: Thought content normal.  Judgment: Judgment normal.     BP (!) 146/82   Pulse 74   Ht 5' 10 (1.778 m)   Wt 242 lb (109.8 kg)   BMI 34.72 kg/m   Past Medical History:  Diagnosis Date   Arthritis    Degeneration of lumbar or lumbosacral intervertebral disc    Diabetes mellitus without complication (HCC)    diet controlled   DVT (deep venous thrombosis) (HCC)    H/O UPPER LEFT LEG   History of angioplasty    Hyperlipidemia    Hypertension    Impotence of organic origin    Inguinal hernia    age 29 year old   Opioid type dependence, continuous (HCC)    Other symptoms involving cardiovascular system    Panic disorder without agoraphobia    Postlaminectomy syndrome, lumbar region    Spasm of muscle    Unspecified vitamin D deficiency     Social History   Socioeconomic History   Marital status: Legally Separated    Spouse name: Not on file   Number of children: Not on file   Years of education: Not on file   Highest education level: Not on file  Occupational History   Not on file  Tobacco Use    Smoking status: Every Day    Current packs/day: 1.50    Average packs/day: 1.5 packs/day for 32.0 years (48.0 ttl pk-yrs)    Types: Cigarettes   Smokeless tobacco: Never  Vaping Use   Vaping status: Never Used  Substance and Sexual Activity   Alcohol use: No   Drug use: No   Sexual activity: Not on file  Other Topics Concern   Not on file  Social History Narrative   Not on file   Social Drivers of Health   Financial Resource Strain: Not on file  Food Insecurity: Not on file  Transportation Needs: Not on file  Physical Activity: Not on file  Stress: Not on file  Social Connections: Not on file  Intimate Partner Violence: Not on file    Past Surgical History:  Procedure Laterality Date   ARTHRODESIS  2003   BACK SURGERY     CARDIAC CATHETERIZATION  2010   [facility]/65% blockage left leg no stent   CATARACT EXTRACTION W/PHACO Right 05/02/2017   Procedure: CATARACT EXTRACTION PHACO AND INTRAOCULAR LENS PLACEMENT (IOC);  Surgeon: Myrna Adine Anes, MD;  Location: [facility];  Service: Ophthalmology;  Laterality: Right;  [place]  01:20.0 AP%20.0 CDE16.19 Fluid pack lot # [id number]   CYSTOSCOPY W/ RETROGRADES Right 05/01/2019   Procedure: CYSTOSCOPY WITH RETROGRADE PYELOGRAM;  Surgeon: Francisca Redell BROCKS, MD;  Location: [facility];  Service: Urology;  Laterality: Right;   CYSTOSCOPY WITH URETEROSCOPY AND STENT PLACEMENT Right 04/16/2019   Procedure: CYSTOSCOPY WITH URETEROSCOPY AND STENT PLACEMENT;  Surgeon: Francisca Redell BROCKS, MD;  Location: [facility];  Service: Urology;  Laterality: Right;   CYSTOSCOPY/URETEROSCOPY/HOLMIUM LASER/STENT PLACEMENT Right 05/01/2019   Procedure: CYSTOSCOPY/URETEROSCOPY/HOLMIUM LASER/STENT Exchange;  Surgeon: Francisca Redell BROCKS, MD;  Location: [facility];  Service: Urology;  Laterality: Right;   INGUINAL HERNIA REPAIR  2003   LAMINECTOMY      Family History  Problem Relation Age of Onset   Heart disease Father    Heart attack Father     Allergies  Allergen  Reactions   Crestor [Rosuvastatin]     joint pain       Latest Ref Rng & Units 04/08/2019   12:34 AM  CBC  WBC 4.0 - 10.5 K/uL 13.3  Hemoglobin 13.0 - 17.0 g/dL 85.0   Hematocrit 60.9 - 52.0 % 46.0   Platelets 150 - 400 K/uL 232       CMP     Component Value Date/Time   NA 137 04/08/2019 0034   K 3.7 04/08/2019 0034   CL 103 04/08/2019 0034   CO2 25 04/08/2019 0034   GLUCOSE 131 (H) 04/08/2019 0034   BUN 20 04/08/2019 0034   CREATININE 1.14 04/08/2019 0034   CREATININE 1.00 08/06/2016 0919   CALCIUM 9.2 04/08/2019 0034   PROT 7.8 04/08/2019 0034   ALBUMIN 4.3 04/08/2019 0034   AST 17 04/08/2019 0034   ALT 13 04/08/2019 0034   ALKPHOS 106 04/08/2019 0034   BILITOT 0.5 04/08/2019 0034   GFRNONAA >60 04/08/2019 0034   GFRNONAA 85 08/06/2016 0919     No results found.     Assessment & Plan:   1. Atherosclerosis of native artery of left lower extremity with rest pain (HCC) (Primary) Patient presents to clinic today with atherosclerosis with resting pain to his left lower extremity.  Patient underwent ultrasounds with ABIs today.  Right lower extremity ABI is 1.15 and left lower extremity ABI is 0.83 today.  Patient with known diagnosis of PAD in his left lower extremity from 2014 at [facility].  Patient's been treated with Pletal  100 mg twice a day for the last 10 years before he was taken off of it last year.  He then redeveloped symptoms.  He was restarted on the Pletal  but it did not help with any of his symptoms.  He realized aspirin helps his symptoms so he was taking 8-10 baby aspirin 3-4 times a day.  Patient denies any black tarry stools or any blood in the stool at this time.  I reviewed with the patient that he should not be taking his Pletal  and that amount of aspirin daily.  He is currently on oxycodone  for pain.  I will order him Neurontin  today to help with some of the pain to his lower extremities.  Recommend:  The patient has evidence of severe atherosclerotic  changes of Left lower extremity with rest pain that is associated with preulcerative changes and impending tissue loss of the Left foot.  This represents a limb threatening ischemia and places the patient at the risk for left lower limb loss.  Patient should undergo angiography of the left lower extremity with the hope for intervention for limb salvage.  The risks and benefits as well as the alternative therapies was discussed in detail with the patient.  All questions were answered.  Patient agrees to proceed with left lower extremity angiography.  The patient will follow up with me in the office after the procedure.      2. Primary hypertension Continue antihypertensive medications as already ordered, these medications have been reviewed and there are no changes at this time.  3. Mixed hyperlipidemia Continue statin as ordered and reviewed, no changes at this time   Current Outpatient Medications on File Prior to Visit  Medication Sig Dispense Refill   aspirin 81 MG tablet Take 81 mg by mouth daily.     colchicine 0.6 MG tablet Take 2 tablets (1.2 mg) at onset of gout flare. Take 1 additional tablet (0.6 mg) one hour later if symptoms persist. Maximum dose is 1.8 mg (3 tablets) in 24 hours. Then take one tablet (0.6 mg) daily until symptoms resolve.     ibuprofen  (ADVIL ) 800 MG tablet Take 1 tablet (800 mg total)  by mouth every 8 (eight) hours as needed. 30 tablet 0   lisinopril  (ZESTRIL ) 10 MG tablet Take 10 mg by mouth daily.     oxyCODONE -acetaminophen  (PERCOCET ) 10-325 MG tablet Take by mouth.     simvastatin (ZOCOR) 10 MG tablet Take 10 mg by mouth at bedtime.     ciprofloxacin  (CIPRO ) 250 MG tablet Take 1 tablet (250 mg total) by mouth 2 (two) times daily. (Patient not taking: Reported on 02/18/2024) 10 tablet 0   ondansetron  (ZOFRAN  ODT) 4 MG disintegrating tablet Take 1 tablet (4 mg total) by mouth every 8 (eight) hours as needed. (Patient not taking: Reported on 02/18/2024) 20 tablet  0   oxybutynin  (DITROPAN -XL) 10 MG 24 hr tablet Take 1 tablet (10 mg total) by mouth at bedtime. (Patient not taking: Reported on 02/18/2024) 30 tablet 0   tamsulosin  (FLOMAX ) 0.4 MG CAPS capsule Take 1 capsule (0.4 mg total) by mouth daily after supper. (Patient not taking: Reported on 02/18/2024) 14 capsule 0   No current facility-administered medications on file prior to visit.    There are no Patient Instructions on file for this visit. No follow-ups on file.   Gwendlyn JONELLE Shank, NP

## 2024-02-18 NOTE — Progress Notes (Signed)
 Subjective:    Patient ID: Michael Giles, male    DOB: 11-19-1960, 63 y.o.   MRN: [id number] Chief Complaint  Patient presents with   New Patient (Initial Visit)    np. ABI + consult. PVD.     Michael Giles is a 63 yo male who presents to clinic today with chief complaint of left lower extremity leg pain.  Patient states his leg pain started back in 2014.  He was seen at [facility] at that time and underwent left lower extremity angiogram.  The results showed that he had a blockage in his left upper groin area which he is unable to exactly name.  He states that at the time they told him they were unable to place a stent due to blood supply that would have been blocked off if the stent was in place.  At that time they placed him on Pletal  100 mg twice daily.  He had been on this since 2014 and has not had any leg issues.  He was taken off of it last year by his primary care in 2024.  His left foot and leg pain then returned.  He was unable to get any Pletal  so he started using aspirin.  He was taking 8-10 baby aspirin 3-4 times a day.  He was also placed back on Pletal  after this but he states that it does not help and therefore was using the aspirin.  Patient states that his left foot gets sharp shooting and stinging pains.  He states he sleeps sitting up as he cannot lay down because if he does the pain is intense.  He is on oxycodone  already daily for prior back pains and he states that that does nothing for his pain today.  He presents today for evaluation.  He underwent vascular ultrasounds with ABIs today.  Right ABI is 1.04 with triphasic pulses.  No prior ABIs listed. Left ABI is 0.83 with monophasic pulses.  No prior ABIs listed.    Review of Systems  Constitutional: Negative.   Musculoskeletal:  Positive for arthralgias, back pain and gait problem.  Skin:  Positive for color change.  All other systems reviewed and are negative.      Objective:   Physical Exam Constitutional:       Appearance: Normal appearance. He is obese.  HENT:     Head: Normocephalic.  Eyes:     Pupils: Pupils are equal, round, and reactive to light.  Cardiovascular:     Rate and Rhythm: Normal rate and regular rhythm.     Pulses: Normal pulses.     Heart sounds: Normal heart sounds.  Pulmonary:     Effort: Pulmonary effort is normal.     Breath sounds: Normal breath sounds.  Abdominal:     General: Bowel sounds are normal.     Palpations: Abdomen is soft.  Musculoskeletal:        General: Tenderness present.  Skin:    General: Skin is warm and dry.     Capillary Refill: Capillary refill takes 2 to 3 seconds.  Neurological:     General: No focal deficit present.     Mental Status: He is alert and oriented to person, place, and time. Mental status is at baseline.     Gait: Gait abnormal.  Psychiatric:        Mood and Affect: Mood normal.        Thought Content: Thought content normal.  Judgment: Judgment normal.     BP (!) 146/82   Pulse 74   Ht 5' 10 (1.778 m)   Wt 242 lb (109.8 kg)   BMI 34.72 kg/m   Past Medical History:  Diagnosis Date   Arthritis    Degeneration of lumbar or lumbosacral intervertebral disc    Diabetes mellitus without complication (HCC)    diet controlled   DVT (deep venous thrombosis) (HCC)    H/O UPPER LEFT LEG   History of angioplasty    Hyperlipidemia    Hypertension    Impotence of organic origin    Inguinal hernia    age 29 year old   Opioid type dependence, continuous (HCC)    Other symptoms involving cardiovascular system    Panic disorder without agoraphobia    Postlaminectomy syndrome, lumbar region    Spasm of muscle    Unspecified vitamin D deficiency     Social History   Socioeconomic History   Marital status: Legally Separated    Spouse name: Not on file   Number of children: Not on file   Years of education: Not on file   Highest education level: Not on file  Occupational History   Not on file  Tobacco Use    Smoking status: Every Day    Current packs/day: 1.50    Average packs/day: 1.5 packs/day for 32.0 years (48.0 ttl pk-yrs)    Types: Cigarettes   Smokeless tobacco: Never  Vaping Use   Vaping status: Never Used  Substance and Sexual Activity   Alcohol use: No   Drug use: No   Sexual activity: Not on file  Other Topics Concern   Not on file  Social History Narrative   Not on file   Social Drivers of Health   Financial Resource Strain: Not on file  Food Insecurity: Not on file  Transportation Needs: Not on file  Physical Activity: Not on file  Stress: Not on file  Social Connections: Not on file  Intimate Partner Violence: Not on file    Past Surgical History:  Procedure Laterality Date   ARTHRODESIS  2003   BACK SURGERY     CARDIAC CATHETERIZATION  2010   [facility]/65% blockage left leg no stent   CATARACT EXTRACTION W/PHACO Right 05/02/2017   Procedure: CATARACT EXTRACTION PHACO AND INTRAOCULAR LENS PLACEMENT (IOC);  Surgeon: Myrna Adine Anes, MD;  Location: [facility] ORS;  Service: Ophthalmology;  Laterality: Right;  US  01:20.0 AP%20.0 CDE16.19 Fluid pack lot # [id number]   CYSTOSCOPY W/ RETROGRADES Right 05/01/2019   Procedure: CYSTOSCOPY WITH RETROGRADE PYELOGRAM;  Surgeon: Francisca Redell BROCKS, MD;  Location: [facility] ORS;  Service: Urology;  Laterality: Right;   CYSTOSCOPY WITH URETEROSCOPY AND STENT PLACEMENT Right 04/16/2019   Procedure: CYSTOSCOPY WITH URETEROSCOPY AND STENT PLACEMENT;  Surgeon: Francisca Redell BROCKS, MD;  Location: [facility] ORS;  Service: Urology;  Laterality: Right;   CYSTOSCOPY/URETEROSCOPY/HOLMIUM LASER/STENT PLACEMENT Right 05/01/2019   Procedure: CYSTOSCOPY/URETEROSCOPY/HOLMIUM LASER/STENT Exchange;  Surgeon: Francisca Redell BROCKS, MD;  Location: [facility] ORS;  Service: Urology;  Laterality: Right;   INGUINAL HERNIA REPAIR  2003   LAMINECTOMY      Family History  Problem Relation Age of Onset   Heart disease Father    Heart attack Father     Allergies  Allergen  Reactions   Crestor [Rosuvastatin]     joint pain       Latest Ref Rng & Units 04/08/2019   12:34 AM  CBC  WBC 4.0 - 10.5 K/uL 13.3  Hemoglobin 13.0 - 17.0 g/dL 85.0   Hematocrit 60.9 - 52.0 % 46.0   Platelets 150 - 400 K/uL 232       CMP     Component Value Date/Time   NA 137 04/08/2019 0034   K 3.7 04/08/2019 0034   CL 103 04/08/2019 0034   CO2 25 04/08/2019 0034   GLUCOSE 131 (H) 04/08/2019 0034   BUN 20 04/08/2019 0034   CREATININE 1.14 04/08/2019 0034   CREATININE 1.00 08/06/2016 0919   CALCIUM 9.2 04/08/2019 0034   PROT 7.8 04/08/2019 0034   ALBUMIN 4.3 04/08/2019 0034   AST 17 04/08/2019 0034   ALT 13 04/08/2019 0034   ALKPHOS 106 04/08/2019 0034   BILITOT 0.5 04/08/2019 0034   GFRNONAA >60 04/08/2019 0034   GFRNONAA 85 08/06/2016 0919     No results found.     Assessment & Plan:   1. Atherosclerosis of native artery of left lower extremity with rest pain (HCC) (Primary) Patient presents to clinic today with atherosclerosis with resting pain to his left lower extremity.  Patient underwent ultrasounds with ABIs today.  Right lower extremity ABI is 1.15 and left lower extremity ABI is 0.83 today.  Patient with known diagnosis of PAD in his left lower extremity from 2014 at [facility].  Patient's been treated with Pletal  100 mg twice a day for the last 10 years before he was taken off of it last year.  He then redeveloped symptoms.  He was restarted on the Pletal  but it did not help with any of his symptoms.  He realized aspirin helps his symptoms so he was taking 8-10 baby aspirin 3-4 times a day.  Patient denies any black tarry stools or any blood in the stool at this time.  I reviewed with the patient that he should not be taking his Pletal  and that amount of aspirin daily.  He is currently on oxycodone  for pain.  I will order him Neurontin  today to help with some of the pain to his lower extremities.  Recommend:  The patient has evidence of severe atherosclerotic  changes of Left lower extremity with rest pain that is associated with preulcerative changes and impending tissue loss of the Left foot.  This represents a limb threatening ischemia and places the patient at the risk for left lower limb loss.  Patient should undergo angiography of the left lower extremity with the hope for intervention for limb salvage.  The risks and benefits as well as the alternative therapies was discussed in detail with the patient.  All questions were answered.  Patient agrees to proceed with left lower extremity angiography.  The patient will follow up with me in the office after the procedure.      2. Primary hypertension Continue antihypertensive medications as already ordered, these medications have been reviewed and there are no changes at this time.  3. Mixed hyperlipidemia Continue statin as ordered and reviewed, no changes at this time   Current Outpatient Medications on File Prior to Visit  Medication Sig Dispense Refill   aspirin 81 MG tablet Take 81 mg by mouth daily.     colchicine 0.6 MG tablet Take 2 tablets (1.2 mg) at onset of gout flare. Take 1 additional tablet (0.6 mg) one hour later if symptoms persist. Maximum dose is 1.8 mg (3 tablets) in 24 hours. Then take one tablet (0.6 mg) daily until symptoms resolve.     ibuprofen  (ADVIL ) 800 MG tablet Take 1 tablet (800 mg total)  by mouth every 8 (eight) hours as needed. 30 tablet 0   lisinopril  (ZESTRIL ) 10 MG tablet Take 10 mg by mouth daily.     oxyCODONE -acetaminophen  (PERCOCET ) 10-325 MG tablet Take by mouth.     simvastatin (ZOCOR) 10 MG tablet Take 10 mg by mouth at bedtime.     ciprofloxacin  (CIPRO ) 250 MG tablet Take 1 tablet (250 mg total) by mouth 2 (two) times daily. (Patient not taking: Reported on 02/18/2024) 10 tablet 0   ondansetron  (ZOFRAN  ODT) 4 MG disintegrating tablet Take 1 tablet (4 mg total) by mouth every 8 (eight) hours as needed. (Patient not taking: Reported on 02/18/2024) 20 tablet  0   oxybutynin  (DITROPAN -XL) 10 MG 24 hr tablet Take 1 tablet (10 mg total) by mouth at bedtime. (Patient not taking: Reported on 02/18/2024) 30 tablet 0   tamsulosin  (FLOMAX ) 0.4 MG CAPS capsule Take 1 capsule (0.4 mg total) by mouth daily after supper. (Patient not taking: Reported on 02/18/2024) 14 capsule 0   No current facility-administered medications on file prior to visit.    There are no Patient Instructions on file for this visit. No follow-ups on file.   Gwendlyn JONELLE Shank, NP

## 2024-02-19 MED ORDER — GABAPENTIN 600 MG PO TABS: 600.0000 mg | ORAL_TABLET | Freq: Three times a day (TID) | ORAL | 1 refills | Status: DC

## 2024-02-20 ENCOUNTER — Telehealth (INDEPENDENT_AMBULATORY_CARE_PROVIDER_SITE_OTHER): Payer: Self-pay

## 2024-02-20 LAB — VAS US ABI WITH/WO TBI
Left ABI: 0.83
Right ABI: 1.15

## 2024-02-20 NOTE — Telephone Encounter (Signed)
 Spoke with the patient and he is scheduled with Dr. Marea on 03/02/24 with a 12:30 pm arrival time to the [facility] for a LLE angio. Pre-procedure instructions were discussed and will be mailed.

## 2024-03-02 ENCOUNTER — Other Ambulatory Visit: Payer: Self-pay

## 2024-03-02 ENCOUNTER — Encounter: Payer: Self-pay | Admitting: Vascular Surgery

## 2024-03-02 ENCOUNTER — Ambulatory Visit
Admission: RE | Admit: 2024-03-02 | Discharge: 2024-03-02 | Disposition: A | Discharge status: Home / Self Care | Attending: Vascular Surgery | Admitting: Vascular Surgery

## 2024-03-02 ENCOUNTER — Encounter
Admission: RE | Disposition: A | Discharge status: Home / Self Care | Payer: Self-pay | Source: Home / Self Care | Attending: Vascular Surgery

## 2024-03-02 DIAGNOSIS — I10 Essential (primary) hypertension: Secondary | ICD-10-CM | POA: Diagnosis not present

## 2024-03-02 DIAGNOSIS — I70229 Atherosclerosis of native arteries of extremities with rest pain, unspecified extremity: Secondary | ICD-10-CM

## 2024-03-02 DIAGNOSIS — Z7982 Long term (current) use of aspirin: Secondary | ICD-10-CM | POA: Diagnosis not present

## 2024-03-02 DIAGNOSIS — F17210 Nicotine dependence, cigarettes, uncomplicated: Secondary | ICD-10-CM | POA: Diagnosis not present

## 2024-03-02 DIAGNOSIS — I70222 Atherosclerosis of native arteries of extremities with rest pain, left leg: Secondary | ICD-10-CM

## 2024-03-02 DIAGNOSIS — E782 Mixed hyperlipidemia: Secondary | ICD-10-CM | POA: Diagnosis not present

## 2024-03-02 DIAGNOSIS — Z79891 Long term (current) use of opiate analgesic: Secondary | ICD-10-CM | POA: Diagnosis not present

## 2024-03-02 DIAGNOSIS — I708 Atherosclerosis of other arteries: Secondary | ICD-10-CM

## 2024-03-02 HISTORY — PX: LOWER EXTREMITY ANGIOGRAPHY: CATH118251

## 2024-03-02 LAB — GLUCOSE, CAPILLARY: Glucose-Capillary: 94 mg/dL (ref 70–99)

## 2024-03-02 LAB — CREATININE, SERUM
Creatinine, Ser: 1.36 mg/dL — ABNORMAL HIGH (ref 0.61–1.24)
GFR, Estimated: 59 mL/min — ABNORMAL LOW (ref >60)

## 2024-03-02 LAB — BUN: BUN: 18 mg/dL (ref 8–23)

## 2024-03-02 SURGERY — LOWER EXTREMITY ANGIOGRAPHY
Anesthesia: Moderate Sedation | Site: Leg Lower | Laterality: Left

## 2024-03-02 MED ORDER — FENTANYL CITRATE (PF) 100 MCG/2ML IJ SOLN
INTRAMUSCULAR | Status: DC | PRN
  Administered 2024-03-02: 50 ug via INTRAVENOUS

## 2024-03-02 MED ORDER — DIPHENHYDRAMINE HCL 50 MG/ML IJ SOLN: 50.0000 mg | Freq: Once | INTRAMUSCULAR | Status: DC | PRN

## 2024-03-02 MED ORDER — IODIXANOL 320 MG/ML IV SOLN
INTRAVENOUS | Status: DC | PRN
  Administered 2024-03-02: 55 mL

## 2024-03-02 MED ORDER — CLOPIDOGREL BISULFATE 75 MG PO TABS
ORAL_TABLET | ORAL | Status: AC
  Filled 2024-03-02: qty 1

## 2024-03-02 MED ORDER — SODIUM CHLORIDE 0.9 % IV SOLN: 250.0000 mL | INTRAVENOUS | Status: DC | PRN

## 2024-03-02 MED ORDER — HEPARIN SODIUM (PORCINE) 1000 UNIT/ML IJ SOLN
INTRAMUSCULAR | Status: DC | PRN
  Administered 2024-03-02: 5000 [IU] via INTRAVENOUS

## 2024-03-02 MED ORDER — CEFAZOLIN SODIUM-DEXTROSE 2-4 GM/100ML-% IV SOLN
2.0000 g | INTRAVENOUS | Status: AC
  Administered 2024-03-02: 2 g via INTRAVENOUS

## 2024-03-02 MED ORDER — ONDANSETRON HCL 4 MG/2ML IJ SOLN: 4.0000 mg | Freq: Four times a day (QID) | INTRAMUSCULAR | Status: DC | PRN

## 2024-03-02 MED ORDER — CLOPIDOGREL BISULFATE 75 MG PO TABS: 75.0000 mg | ORAL_TABLET | Freq: Every day | ORAL | 11 refills | Status: AC

## 2024-03-02 MED ORDER — HYDROMORPHONE HCL 1 MG/ML IJ SOLN: 1.0000 mg | Freq: Once | INTRAMUSCULAR | Status: DC | PRN

## 2024-03-02 MED ORDER — SODIUM CHLORIDE 0.9 % IV BOLUS
250.0000 mL | Freq: Once | INTRAVENOUS | Status: AC
  Administered 2024-03-02: 250 mL via INTRAVENOUS

## 2024-03-02 MED ORDER — FAMOTIDINE 20 MG PO TABS: 40.0000 mg | ORAL_TABLET | Freq: Once | ORAL | Status: DC | PRN

## 2024-03-02 MED ORDER — METHYLPREDNISOLONE SODIUM SUCC 125 MG IJ SOLR: 125.0000 mg | Freq: Once | INTRAMUSCULAR | Status: DC | PRN

## 2024-03-02 MED ORDER — SODIUM CHLORIDE 0.9% FLUSH
3.0000 mL | INTRAVENOUS | Status: DC | PRN
Start: 2024-03-02 — End: 2024-03-02

## 2024-03-02 MED ORDER — HEPARIN (PORCINE) IN NACL 1000-0.9 UT/500ML-% IV SOLN
INTRAVENOUS | Status: DC | PRN
  Administered 2024-03-02: 1000 mL

## 2024-03-02 MED ORDER — CEFAZOLIN SODIUM-DEXTROSE 2-4 GM/100ML-% IV SOLN
INTRAVENOUS | Status: AC
  Filled 2024-03-02: qty 100

## 2024-03-02 MED ORDER — HEPARIN SODIUM (PORCINE) 1000 UNIT/ML IJ SOLN
INTRAMUSCULAR | Status: AC
  Filled 2024-03-02: qty 10

## 2024-03-02 MED ORDER — ACETAMINOPHEN 325 MG PO TABS: 650.0000 mg | ORAL_TABLET | ORAL | Status: DC | PRN

## 2024-03-02 MED ORDER — MIDAZOLAM HCL 2 MG/2ML IJ SOLN
INTRAMUSCULAR | Status: DC | PRN
  Administered 2024-03-02: 2 mg via INTRAVENOUS

## 2024-03-02 MED ORDER — HYDRALAZINE HCL 20 MG/ML IJ SOLN: 5.0000 mg | INTRAMUSCULAR | Status: DC | PRN

## 2024-03-02 MED ORDER — SODIUM CHLORIDE 0.9 % IV SOLN: INTRAVENOUS | Status: DC

## 2024-03-02 MED ORDER — MIDAZOLAM HCL 5 MG/5ML IJ SOLN
INTRAMUSCULAR | Status: AC
  Filled 2024-03-02: qty 5

## 2024-03-02 MED ORDER — LABETALOL HCL 5 MG/ML IV SOLN: 10.0000 mg | INTRAVENOUS | Status: DC | PRN

## 2024-03-02 MED ORDER — SODIUM CHLORIDE 0.9% FLUSH: 3.0000 mL | Freq: Two times a day (BID) | INTRAVENOUS | Status: DC

## 2024-03-02 MED ORDER — CLOPIDOGREL BISULFATE 75 MG PO TABS
75.0000 mg | ORAL_TABLET | Freq: Every day | ORAL | Status: DC
  Administered 2024-03-02: 75 mg via ORAL

## 2024-03-02 MED ORDER — LIDOCAINE-EPINEPHRINE (PF) 1 %-1:200000 IJ SOLN
INTRAMUSCULAR | Status: DC | PRN
  Administered 2024-03-02: 10 mL

## 2024-03-02 MED ORDER — FENTANYL CITRATE (PF) 100 MCG/2ML IJ SOLN
INTRAMUSCULAR | Status: AC
  Filled 2024-03-02: qty 2

## 2024-03-02 MED ORDER — MIDAZOLAM HCL 2 MG/ML PO SYRP: 8.0000 mg | ORAL_SOLUTION | Freq: Once | ORAL | Status: DC | PRN

## 2024-03-02 SURGICAL SUPPLY — 17 items
BALLOON DORADO 10X40X80 (BALLOONS) IMPLANT
BALLOON LUTONIX DCB 5X100X130 (BALLOONS) IMPLANT
CATH ANGIO 5F PIGTAIL 65CM (CATHETERS) IMPLANT
CATH BEACON 5 .038 100 VERT TP (CATHETERS) IMPLANT
COVER PROBE ULTRASOUND 5X96 (MISCELLANEOUS) IMPLANT
DEVICE PRESTO INFLATION (MISCELLANEOUS) IMPLANT
DEVICE VASC CLSR CELT ART 6 (Vascular Products) IMPLANT
GLIDEWIRE ADV .035X260CM (WIRE) IMPLANT
PACK ANGIOGRAPHY (CUSTOM PROCEDURE TRAY) ×1 IMPLANT
SHEATH ANL2 6FRX45 HC (SHEATH) IMPLANT
SHEATH BRITE TIP 5FRX11 (SHEATH) IMPLANT
SHEATH BRITE TIP 6FRX11 (SHEATH) IMPLANT
STENT LIFESTAR 14X40 (Permanent Stent) IMPLANT
STENT LIFESTENT 5F 6X100X135 (Permanent Stent) IMPLANT
SYR MEDRAD MARK 7 150ML (SYRINGE) IMPLANT
TUBING CONTRAST HIGH PRESS 72 (TUBING) IMPLANT
WIRE J 3MM .035X145CM (WIRE) IMPLANT

## 2024-03-02 NOTE — Op Note (Signed)
 [facility]  Percutaneous Study/Intervention Procedural Note   Date of Surgery: 03/02/2024  Surgeon(s):Vernia Teem    Assistants:none  Pre-operative Diagnosis: PAD with claudication and early rest pain LLE  Post-operative diagnosis:  Same  Procedure(s) Performed:             1.  Ultrasound guidance for vascular access right femoral artery             2.  Catheter placement into left common femoral artery and right femoral approach             3.  Aortogram and selective left lower extremity angiogram             4.  Percutaneous transluminal angioplasty of left SFA with 5 mm diameter by 10 cm length Lutonix drug-coated angioplasty balloon             5.  Stent placement to the left SFA with 6 mm diameter by 10 cm length life stent  6.  Stent placement to the distal left common iliac artery and proximal left external iliac artery with 12 mm diameter by 4 cm length life star stent             7.  Celt closure device right femoral artery  EBL: 10 cc  Contrast: 55 cc  Fluoro Time: 5.6 minutes  Moderate Conscious Sedation Time: approximately 41 minutes using 2 mg of Versed  and 50 mcg of Fentanyl               Indications:  Patient is a 63 y.o.male with disabling claudication symptoms and early rest pain of the left lower extremity refractory to medical management and lifestyle modifications. The patient has noninvasive study showing significantly reduced ABI on the left. The patient is brought in for angiography for further evaluation and potential treatment.  Due to the limb threatening nature of the situation, angiogram was performed for attempted limb salvage. The patient is aware that if the procedure fails, amputation would be expected.  The patient also understands that even with successful revascularization, amputation may still be required due to the severity of the situation.  Risks and benefits are discussed and informed consent is obtained.   Procedure:   The patient was identified and appropriate procedural time out was performed.  The patient was then placed supine on the table and prepped and draped in the usual sterile fashion. Moderate conscious sedation was administered during a face to face encounter with the patient throughout the procedure with my supervision of the RN administering medicines and monitoring the patient's vital signs, pulse oximetry, telemetry and mental status throughout from the start of the procedure until the patient was taken to the recovery room. Ultrasound was used to evaluate the right common femoral artery.  It was patent .  A digital ultrasound image was acquired.  A Seldinger needle was used to access the right common femoral artery under direct ultrasound guidance and a permanent image was performed.  A 0.035 J wire was advanced without resistance and a 5Fr sheath was placed.  Pigtail catheter was placed into the aorta and an AP aortogram was performed. This demonstrated normal renal arteries and normal aorta.  The right iliac arteries appeared patent without significant stenosis.  The left distal common iliac artery and proximal external iliac artery had a high-grade stenosis at the iliac bifurcation of greater than 80%. I then crossed the aortic bifurcation and advanced to the left femoral head. Selective left lower extremity  angiogram was then performed. This demonstrated fairly normal common femoral artery, profunda femoris artery, and proximal superficial femoral artery.  The mid to distal SFA had a high-grade nearly occlusive stenosis with some poststenotic dilatation of the distal SFA.  The popliteal artery was fairly normal.  There was then two-vessel runoff with the peroneal and the anterior tibial artery distally without obvious focal stenosis. It was felt that it was in the patient's best interest to proceed with intervention after these images to avoid a second procedure and a larger amount of contrast and fluoroscopy  based off of the findings from the initial angiogram. The patient was systemically heparinized and a 6 [place] [employer] sheath was then placed over the [employer] wire. I then used a Kumpe catheter and the advantage wire to cross the SFA lesion without occultly.  I then performed angioplasty of the left mid to distal SFA with a 5 mm diameter by 10 cm length Lutonix drug-coated angioplasty balloon inflated to 10 atm for 1 minute.  Following angioplasty, there is still a greater than 50% residual stenosis so I elected to stent this area.  A 6 mm diameter by 10 cm length life stent was selected and deployed and then postdilated with a 5 mm diameter Lutonix drug-coated balloon with excellent angiographic completion result and less than 10% residual stenosis.  I then turned my attention to the iliac lesion.  The left distal common iliac artery and proximal external iliac artery were treated with a 12 mm diameter by 4 cm length life star stent.  This was postdilated with a 10 mm balloon with excellent angiographic completion result and less than 10% residual stenosis. I elected to terminate the procedure. The sheath was removed and Celt closure device was deployed in the right femoral artery with excellent hemostatic result. The patient was taken to the recovery room in stable condition having tolerated the procedure well.  Findings:               Aortogram:  This demonstrated normal renal arteries and normal aorta.  The right iliac arteries appeared patent without significant stenosis.  The left distal common iliac artery and proximal external iliac artery had a high-grade stenosis at the iliac bifurcation of greater than 80%.             Left lower Extremity:  This demonstrated fairly normal common femoral artery, profunda femoris artery, and proximal superficial femoral artery.  The mid to distal SFA had a high-grade nearly occlusive stenosis with some poststenotic dilatation of the distal SFA.  The popliteal  artery was fairly normal.  There was then two-vessel runoff with the peroneal and the anterior tibial artery distally without obvious focal stenosis.   Disposition: Patient was taken to the recovery room in stable condition having tolerated the procedure well.  Complications: None  Selinda Gu 03/02/2024 3:15 PM   This note was created with Dragon Medical transcription system. Any errors in dictation are purely unintentional.

## 2024-03-02 NOTE — Interval H&P Note (Signed)
 History and Physical Interval Note:  03/02/2024 12:35 PM  Michael Giles  has presented today for surgery, with the diagnosis of LLE Angio   ASO w rest pain.  The various methods of treatment have been discussed with the patient and family. After consideration of risks, benefits and other options for treatment, the patient has consented to  Procedure(s): Lower Extremity Angiography (Left) as a surgical intervention.  The patient's history has been reviewed, patient examined, no change in status, stable for surgery.  I have reviewed the patient's chart and labs.  Questions were answered to the patient's satisfaction.     [name]

## 2024-03-02 NOTE — Interval H&P Note (Signed)
 History and Physical Interval Note:  03/02/2024 12:32 PM  Michael Giles  has presented today for surgery, with the diagnosis of LLE Angio   ASO w rest pain.  The various methods of treatment have been discussed with the patient and family. After consideration of risks, benefits and other options for treatment, the patient has consented to  Procedure(s): Lower Extremity Angiography (Left) as a surgical intervention.  The patient's history has been reviewed, patient examined, no change in status, stable for surgery.  I have reviewed the patient's chart and labs.  Questions were answered to the patient's satisfaction.     [name]

## 2024-03-03 ENCOUNTER — Encounter: Payer: Self-pay | Admitting: Vascular Surgery

## 2024-04-02 ENCOUNTER — Other Ambulatory Visit (INDEPENDENT_AMBULATORY_CARE_PROVIDER_SITE_OTHER): Payer: Self-pay | Admitting: Vascular Surgery

## 2024-04-02 DIAGNOSIS — I739 Peripheral vascular disease, unspecified: Secondary | ICD-10-CM

## 2024-04-03 ENCOUNTER — Encounter (INDEPENDENT_AMBULATORY_CARE_PROVIDER_SITE_OTHER)

## 2024-04-03 ENCOUNTER — Ambulatory Visit (INDEPENDENT_AMBULATORY_CARE_PROVIDER_SITE_OTHER): Admitting: Nurse Practitioner
